# Patient Record
Sex: Female | Born: 1945 | Race: Black or African American | Hispanic: No | Smoking: Never smoker
Health system: Southern US, Community
[De-identification: ages and names within clinical notes are randomized; demographics above are authoritative.]

## PROBLEM LIST (undated history)

## (undated) DIAGNOSIS — F209 Schizophrenia, unspecified: Secondary | ICD-10-CM

## (undated) DIAGNOSIS — E119 Type 2 diabetes mellitus without complications: Secondary | ICD-10-CM

## (undated) DIAGNOSIS — I219 Acute myocardial infarction, unspecified: Secondary | ICD-10-CM

## (undated) DIAGNOSIS — I2510 Atherosclerotic heart disease of native coronary artery without angina pectoris: Secondary | ICD-10-CM

## (undated) DIAGNOSIS — M1990 Unspecified osteoarthritis, unspecified site: Secondary | ICD-10-CM

## (undated) DIAGNOSIS — I639 Cerebral infarction, unspecified: Secondary | ICD-10-CM

## (undated) DIAGNOSIS — K8020 Calculus of gallbladder without cholecystitis without obstruction: Secondary | ICD-10-CM

## (undated) DIAGNOSIS — I10 Essential (primary) hypertension: Secondary | ICD-10-CM

## (undated) DIAGNOSIS — K219 Gastro-esophageal reflux disease without esophagitis: Secondary | ICD-10-CM

## (undated) HISTORY — PX: OTHER SURGICAL HISTORY: SHX169

## (undated) HISTORY — DX: Gastro-esophageal reflux disease without esophagitis: K21.9

## (undated) HISTORY — DX: Calculus of gallbladder without cholecystitis without obstruction: K80.20

## (undated) HISTORY — DX: Cerebral infarction, unspecified: I63.9

## (undated) HISTORY — PX: CORONARY ARTERY BYPASS GRAFT: SHX141

## (undated) HISTORY — PX: VENTRAL HERNIA REPAIR: SHX424

## (undated) HISTORY — PX: TOTAL ABDOMINAL HYSTERECTOMY: SHX209

## (undated) HISTORY — DX: Acute myocardial infarction, unspecified: I21.9

---

## 2003-10-19 ENCOUNTER — Emergency Department (HOSPITAL_COMMUNITY): Admission: EM | Admit: 2003-10-19 | Discharge: 2003-10-19 | Payer: Self-pay | Admitting: Emergency Medicine

## 2004-02-25 ENCOUNTER — Encounter (INDEPENDENT_AMBULATORY_CARE_PROVIDER_SITE_OTHER): Payer: Self-pay | Admitting: *Deleted

## 2004-02-25 ENCOUNTER — Ambulatory Visit (HOSPITAL_COMMUNITY): Admission: RE | Admit: 2004-02-25 | Discharge: 2004-02-25 | Payer: Self-pay | Admitting: Gastroenterology

## 2004-03-14 ENCOUNTER — Inpatient Hospital Stay (HOSPITAL_COMMUNITY): Admission: EM | Admit: 2004-03-14 | Discharge: 2004-03-17 | Payer: Self-pay | Admitting: Emergency Medicine

## 2004-10-15 ENCOUNTER — Encounter: Admission: RE | Admit: 2004-10-15 | Discharge: 2004-10-15 | Payer: Self-pay | Admitting: Internal Medicine

## 2004-10-30 ENCOUNTER — Emergency Department (HOSPITAL_COMMUNITY): Admission: EM | Admit: 2004-10-30 | Discharge: 2004-10-30 | Payer: Self-pay | Admitting: Family Medicine

## 2004-11-04 ENCOUNTER — Inpatient Hospital Stay (HOSPITAL_COMMUNITY): Admission: EM | Admit: 2004-11-04 | Discharge: 2004-11-09 | Payer: Self-pay | Admitting: Emergency Medicine

## 2004-11-05 ENCOUNTER — Encounter (INDEPENDENT_AMBULATORY_CARE_PROVIDER_SITE_OTHER): Payer: Self-pay | Admitting: *Deleted

## 2004-11-27 ENCOUNTER — Ambulatory Visit (HOSPITAL_COMMUNITY): Admission: RE | Admit: 2004-11-27 | Discharge: 2004-11-27 | Payer: Self-pay | Admitting: Cardiovascular Disease

## 2005-03-06 ENCOUNTER — Emergency Department (HOSPITAL_COMMUNITY): Admission: EM | Admit: 2005-03-06 | Discharge: 2005-03-06 | Payer: Self-pay | Admitting: Family Medicine

## 2005-05-14 ENCOUNTER — Emergency Department (HOSPITAL_COMMUNITY): Admission: EM | Admit: 2005-05-14 | Discharge: 2005-05-14 | Payer: Self-pay | Admitting: Family Medicine

## 2005-08-09 ENCOUNTER — Emergency Department (HOSPITAL_COMMUNITY): Admission: EM | Admit: 2005-08-09 | Discharge: 2005-08-09 | Payer: Self-pay | Admitting: Family Medicine

## 2005-11-21 ENCOUNTER — Emergency Department (HOSPITAL_COMMUNITY): Admission: EM | Admit: 2005-11-21 | Discharge: 2005-11-21 | Payer: Self-pay | Admitting: Family Medicine

## 2006-02-19 ENCOUNTER — Emergency Department (HOSPITAL_COMMUNITY): Admission: EM | Admit: 2006-02-19 | Discharge: 2006-02-19 | Payer: Self-pay | Admitting: Family Medicine

## 2006-05-26 ENCOUNTER — Emergency Department (HOSPITAL_COMMUNITY): Admission: EM | Admit: 2006-05-26 | Discharge: 2006-05-26 | Payer: Self-pay | Admitting: Family Medicine

## 2006-10-25 ENCOUNTER — Encounter: Admission: RE | Admit: 2006-10-25 | Discharge: 2006-10-25 | Payer: Self-pay | Admitting: Internal Medicine

## 2006-11-19 ENCOUNTER — Emergency Department (HOSPITAL_COMMUNITY): Admission: EM | Admit: 2006-11-19 | Discharge: 2006-11-19 | Payer: Self-pay | Admitting: Family Medicine

## 2007-02-04 ENCOUNTER — Emergency Department (HOSPITAL_COMMUNITY): Admission: EM | Admit: 2007-02-04 | Discharge: 2007-02-04 | Payer: Self-pay | Admitting: Family Medicine

## 2007-10-29 ENCOUNTER — Encounter: Admission: RE | Admit: 2007-10-29 | Discharge: 2007-10-29 | Payer: Self-pay | Admitting: Obstetrics and Gynecology

## 2008-03-11 ENCOUNTER — Inpatient Hospital Stay (HOSPITAL_COMMUNITY): Admission: EM | Admit: 2008-03-11 | Discharge: 2008-03-14 | Payer: Self-pay | Admitting: Emergency Medicine

## 2008-04-14 ENCOUNTER — Emergency Department (HOSPITAL_COMMUNITY): Admission: EM | Admit: 2008-04-14 | Discharge: 2008-04-14 | Payer: Self-pay | Admitting: Emergency Medicine

## 2008-10-29 ENCOUNTER — Encounter: Admission: RE | Admit: 2008-10-29 | Discharge: 2008-10-29 | Payer: Self-pay | Admitting: Internal Medicine

## 2009-01-28 ENCOUNTER — Ambulatory Visit: Payer: Self-pay | Admitting: Vascular Surgery

## 2009-02-04 ENCOUNTER — Ambulatory Visit: Payer: Self-pay | Admitting: Vascular Surgery

## 2009-05-01 ENCOUNTER — Ambulatory Visit: Payer: Self-pay | Admitting: Vascular Surgery

## 2009-05-27 ENCOUNTER — Ambulatory Visit: Payer: Self-pay | Admitting: Vascular Surgery

## 2009-06-03 ENCOUNTER — Ambulatory Visit: Payer: Self-pay | Admitting: Vascular Surgery

## 2010-09-13 ENCOUNTER — Emergency Department (HOSPITAL_COMMUNITY)
Admission: EM | Admit: 2010-09-13 | Discharge: 2010-09-13 | Payer: Self-pay | Source: Home / Self Care | Admitting: Family Medicine

## 2010-11-01 ENCOUNTER — Inpatient Hospital Stay (HOSPITAL_COMMUNITY)
Admission: EM | Admit: 2010-11-01 | Discharge: 2010-11-03 | Payer: Self-pay | Source: Home / Self Care | Attending: Cardiovascular Disease | Admitting: Cardiovascular Disease

## 2010-11-01 ENCOUNTER — Emergency Department (HOSPITAL_COMMUNITY)
Admission: EM | Admit: 2010-11-01 | Discharge: 2010-11-01 | Disposition: A | Discharge status: Other Acute Inpatient Hospital | Payer: Self-pay | Source: Home / Self Care | Admitting: Emergency Medicine

## 2010-12-11 ENCOUNTER — Encounter: Payer: Self-pay | Admitting: Cardiovascular Disease

## 2010-12-27 ENCOUNTER — Emergency Department (HOSPITAL_COMMUNITY)
Admission: EM | Admit: 2010-12-27 | Discharge: 2010-12-27 | Disposition: A | Discharge status: Home / Self Care | Payer: PRIVATE HEALTH INSURANCE | Attending: Emergency Medicine | Admitting: Emergency Medicine

## 2010-12-27 ENCOUNTER — Emergency Department (HOSPITAL_COMMUNITY): Payer: PRIVATE HEALTH INSURANCE

## 2010-12-27 DIAGNOSIS — I10 Essential (primary) hypertension: Secondary | ICD-10-CM | POA: Insufficient documentation

## 2010-12-27 DIAGNOSIS — R1310 Dysphagia, unspecified: Secondary | ICD-10-CM | POA: Insufficient documentation

## 2010-12-27 DIAGNOSIS — E7800 Pure hypercholesterolemia, unspecified: Secondary | ICD-10-CM | POA: Insufficient documentation

## 2010-12-27 DIAGNOSIS — R112 Nausea with vomiting, unspecified: Secondary | ICD-10-CM | POA: Insufficient documentation

## 2010-12-27 DIAGNOSIS — M549 Dorsalgia, unspecified: Secondary | ICD-10-CM | POA: Insufficient documentation

## 2010-12-27 DIAGNOSIS — Z951 Presence of aortocoronary bypass graft: Secondary | ICD-10-CM | POA: Insufficient documentation

## 2010-12-27 DIAGNOSIS — E669 Obesity, unspecified: Secondary | ICD-10-CM | POA: Insufficient documentation

## 2010-12-27 DIAGNOSIS — R072 Precordial pain: Secondary | ICD-10-CM | POA: Insufficient documentation

## 2010-12-27 DIAGNOSIS — I252 Old myocardial infarction: Secondary | ICD-10-CM | POA: Insufficient documentation

## 2010-12-27 DIAGNOSIS — E119 Type 2 diabetes mellitus without complications: Secondary | ICD-10-CM | POA: Insufficient documentation

## 2010-12-27 DIAGNOSIS — R61 Generalized hyperhidrosis: Secondary | ICD-10-CM | POA: Insufficient documentation

## 2010-12-27 DIAGNOSIS — K219 Gastro-esophageal reflux disease without esophagitis: Secondary | ICD-10-CM | POA: Insufficient documentation

## 2010-12-27 DIAGNOSIS — I2510 Atherosclerotic heart disease of native coronary artery without angina pectoris: Secondary | ICD-10-CM | POA: Insufficient documentation

## 2010-12-27 LAB — POCT CARDIAC MARKERS
CKMB, poc: <1 ng/mL — ABNORMAL LOW (ref 1.0–8.0)
Myoglobin, poc: 32.7 ng/mL (ref 12–200)
Troponin i, poc: <0.05 ng/mL (ref 0.00–0.09)

## 2010-12-27 LAB — CBC
HCT: 34.7 % — ABNORMAL LOW (ref 36.0–46.0)
Hemoglobin: 11.2 g/dL — ABNORMAL LOW (ref 12.0–15.0)
MCH: 28.1 pg (ref 26.0–34.0)
MCHC: 32.3 g/dL (ref 30.0–36.0)
MCV: 87.2 fL (ref 78.0–100.0)
Platelets: 222 10*3/uL (ref 150–400)
RBC: 3.98 MIL/uL (ref 3.87–5.11)
RDW: 13.4 % (ref 11.5–15.5)
WBC: 8 10*3/uL (ref 4.0–10.5)

## 2010-12-27 LAB — COMPREHENSIVE METABOLIC PANEL
ALT: 13 U/L (ref 0–35)
AST: 23 U/L (ref 0–37)
Albumin: 3.6 g/dL (ref 3.5–5.2)
Alkaline Phosphatase: 47 U/L (ref 39–117)
BUN: 6 mg/dL (ref 6–23)
CO2: 26 mEq/L (ref 19–32)
Calcium: 8.8 mg/dL (ref 8.4–10.5)
Chloride: 106 mEq/L (ref 96–112)
Creatinine, Ser: 0.81 mg/dL (ref 0.4–1.2)
GFR calc Af Amer: >60 mL/min (ref >60)
GFR calc non Af Amer: >60 mL/min (ref >60)
Glucose, Bld: 139 mg/dL — ABNORMAL HIGH (ref 70–99)
Potassium: 4.4 mEq/L (ref 3.5–5.1)
Sodium: 141 mEq/L (ref 135–145)
Total Bilirubin: 0.7 mg/dL (ref 0.3–1.2)
Total Protein: 6.6 g/dL (ref 6.0–8.3)

## 2010-12-27 LAB — DIFFERENTIAL
Basophils Absolute: 0 10*3/uL (ref 0.0–0.1)
Basophils Relative: 0 % (ref 0–1)
Eosinophils Absolute: 0.1 10*3/uL (ref 0.0–0.7)
Eosinophils Relative: 1 % (ref 0–5)
Lymphocytes Relative: 33 % (ref 12–46)
Lymphs Abs: 2.7 10*3/uL (ref 0.7–4.0)
Monocytes Absolute: 0.7 10*3/uL (ref 0.1–1.0)
Monocytes Relative: 8 % (ref 3–12)
Neutro Abs: 4.6 10*3/uL (ref 1.7–7.7)
Neutrophils Relative %: 57 % (ref 43–77)

## 2011-01-01 ENCOUNTER — Inpatient Hospital Stay (INDEPENDENT_AMBULATORY_CARE_PROVIDER_SITE_OTHER)
Admission: RE | Admit: 2011-01-01 | Discharge: 2011-01-01 | Disposition: A | Discharge status: Home / Self Care | Payer: PRIVATE HEALTH INSURANCE | Source: Ambulatory Visit | Attending: Emergency Medicine | Admitting: Emergency Medicine

## 2011-01-01 DIAGNOSIS — J45909 Unspecified asthma, uncomplicated: Secondary | ICD-10-CM

## 2011-01-01 DIAGNOSIS — R05 Cough: Secondary | ICD-10-CM

## 2011-01-01 DIAGNOSIS — R059 Cough, unspecified: Secondary | ICD-10-CM

## 2011-01-26 ENCOUNTER — Other Ambulatory Visit (HOSPITAL_COMMUNITY): Payer: Self-pay | Admitting: Internal Medicine

## 2011-01-26 DIAGNOSIS — Z1231 Encounter for screening mammogram for malignant neoplasm of breast: Secondary | ICD-10-CM

## 2011-01-31 LAB — CBC
HCT: 33.6 % — ABNORMAL LOW (ref 36.0–46.0)
HCT: 34.8 % — ABNORMAL LOW (ref 36.0–46.0)
Hemoglobin: 10.8 g/dL — ABNORMAL LOW (ref 12.0–15.0)
MCH: 28.3 pg (ref 26.0–34.0)
MCH: 28.3 pg (ref 26.0–34.0)
MCH: 28.4 pg (ref 26.0–34.0)
MCHC: 31.9 g/dL (ref 30.0–36.0)
MCHC: 32.1 g/dL (ref 30.0–36.0)
MCV: 88.2 fL (ref 78.0–100.0)
MCV: 88.8 fL (ref 78.0–100.0)
MCV: 89 fL (ref 78.0–100.0)
Platelets: 208 10*3/uL (ref 150–400)
Platelets: 208 10*3/uL (ref 150–400)
Platelets: 210 10*3/uL (ref 150–400)
RBC: 3.81 MIL/uL — ABNORMAL LOW (ref 3.87–5.11)
RDW: 13.2 % (ref 11.5–15.5)
RDW: 13.3 % (ref 11.5–15.5)
RDW: 13.3 % (ref 11.5–15.5)
WBC: 8.2 10*3/uL (ref 4.0–10.5)
WBC: 8.4 10*3/uL (ref 4.0–10.5)

## 2011-01-31 LAB — BASIC METABOLIC PANEL
BUN: 7 mg/dL (ref 6–23)
CO2: 27 mEq/L (ref 19–32)
Chloride: 110 mEq/L (ref 96–112)
Chloride: 110 mEq/L (ref 96–112)
Creatinine, Ser: 0.68 mg/dL (ref 0.4–1.2)
Creatinine, Ser: 0.7 mg/dL (ref 0.4–1.2)
GFR calc Af Amer: >60 mL/min (ref >60)
Glucose, Bld: 105 mg/dL — ABNORMAL HIGH (ref 70–99)
Potassium: 3.7 mEq/L (ref 3.5–5.1)

## 2011-01-31 LAB — GLUCOSE, CAPILLARY: Glucose-Capillary: 106 mg/dL — ABNORMAL HIGH (ref 70–99)

## 2011-01-31 LAB — MAGNESIUM: Magnesium: 1.2 mg/dL — ABNORMAL LOW (ref 1.5–2.5)

## 2011-01-31 LAB — COMPREHENSIVE METABOLIC PANEL
ALT: 12 U/L (ref 0–35)
AST: 21 U/L (ref 0–37)
Albumin: 3.4 g/dL — ABNORMAL LOW (ref 3.5–5.2)
Alkaline Phosphatase: 46 U/L (ref 39–117)
BUN: 9 mg/dL (ref 6–23)
CO2: 25 mEq/L (ref 19–32)
Calcium: 8.8 mg/dL (ref 8.4–10.5)
Chloride: 109 mEq/L (ref 96–112)
Creatinine, Ser: 0.68 mg/dL (ref 0.4–1.2)
GFR calc Af Amer: >60 mL/min (ref >60)
GFR calc non Af Amer: >60 mL/min (ref >60)
Glucose, Bld: 89 mg/dL (ref 70–99)
Potassium: 4.3 mEq/L (ref 3.5–5.1)
Sodium: 141 mEq/L (ref 135–145)
Total Bilirubin: 0.4 mg/dL (ref 0.3–1.2)
Total Protein: 6.2 g/dL (ref 6.0–8.3)

## 2011-01-31 LAB — BRAIN NATRIURETIC PEPTIDE
Pro B Natriuretic peptide (BNP): 59 pg/mL (ref 0.0–100.0)
Pro B Natriuretic peptide (BNP): 71 pg/mL (ref 0.0–100.0)

## 2011-01-31 LAB — CARDIAC PANEL(CRET KIN+CKTOT+MB+TROPI)
CK, MB: 1 ng/mL (ref 0.3–4.0)
Relative Index: INVALID (ref 0.0–2.5)
Total CK: 81 U/L (ref 7–177)
Troponin I: 0.01 ng/mL (ref 0.00–0.06)

## 2011-01-31 LAB — CK TOTAL AND CKMB (NOT AT ARMC): CK, MB: 1 ng/mL (ref 0.3–4.0)

## 2011-01-31 LAB — POCT CARDIAC MARKERS: CKMB, poc: <1 ng/mL — ABNORMAL LOW (ref 1.0–8.0)

## 2011-01-31 LAB — PROTIME-INR: INR: 0.97 (ref 0.00–1.49)

## 2011-01-31 LAB — TROPONIN I: Troponin I: 0.02 ng/mL (ref 0.00–0.06)

## 2011-01-31 LAB — HEMOGLOBIN A1C: Hgb A1c MFr Bld: 6.2 % — ABNORMAL HIGH (ref <5.7)

## 2011-02-02 ENCOUNTER — Ambulatory Visit (HOSPITAL_COMMUNITY)
Admission: RE | Admit: 2011-02-02 | Discharge: 2011-02-02 | Disposition: A | Discharge status: Home / Self Care | Payer: PRIVATE HEALTH INSURANCE | Source: Ambulatory Visit | Attending: Internal Medicine | Admitting: Internal Medicine

## 2011-02-02 DIAGNOSIS — Z1231 Encounter for screening mammogram for malignant neoplasm of breast: Secondary | ICD-10-CM | POA: Insufficient documentation

## 2011-04-05 NOTE — Assessment & Plan Note (Signed)
OFFICE VISIT   Nicole Lloyd, Nicole Lloyd  DOB:  05/12/1946                                       06/03/2009  CHART#:[id number]   The patient presents today in 1-week followup of laser ablation of her  left small saphenous vein and stab phlebectomy of multiple tributaries.  She has done quite well since the procedure.  Reports minimal  discomfort.  She has very mild bruising in her calf and no discomfort.  She underwent repeat duplex today in our office, and this reveals  closure of her small saphenous vein with no evidence of injury to her  deep system.  I am quite pleased with her initial evaluation and  progress, as is the patient.  We will see her again in 6 weeks for final  followup.   Larina Earthly, M.D.  Electronically Signed   TFE/MEDQ  D:  06/03/2009  T:  06/04/2009  Job:  [id number]   cc:   Merlene Laughter. Renae Gloss, M.D.

## 2011-04-05 NOTE — Assessment & Plan Note (Signed)
OFFICE VISIT   MIKITA, LESMEISTER  DOB:  28-Dec-1945                                       05/27/2009  OACZY#:[id number]   The patient presents today for treatment of her left leg venous  hypertension.  She underwent laser ablation of her left small saphenous  vein and stab phlebectomy multiple tributary varicosities in her  posterior calf.  She did quite well with the procedure without immediate  complication and will see me in 1 week for a duplex follow-up.   Larina Earthly, M.D.  Electronically Signed   TFE/MEDQ  D:  05/27/2009  T:  05/28/2009  Job:  [id number]

## 2011-04-05 NOTE — Procedures (Signed)
LOWER EXTREMITY VENOUS REFLUX EXAM   INDICATION:  Bilateral varicose veins.   EXAM:  Using color-flow imaging and pulse Doppler spectral analysis, the  bilateral common femoral, superficial femoral, popliteal, posterior  tibial, greater and lesser saphenous veins are evaluated.  There is  evidence suggesting deep venous insufficiency in the bilateral lower  extremities at the popliteal level in the right lower extremity and at  the common femoral level in the left lower extremity.   The right saphenofemoral junction is competent.  The left GSV is not  competent with the caliber as described below.  The bilateral greater  saphenous veins are competent.   The right proximal short saphenous vein demonstrates competency.  The  left proximal short saphenous vein demonstrates incompetency with  diameter measurements ranging from 0.37 to 0.87 cm.   GSV Diameter (used if found to be incompetent only)                                            Right    Left  Proximal Greater Saphenous Vein           cm       cm  Proximal-to-mid-thigh                     cm       cm  Mid thigh                                 cm       cm  Mid-distal thigh                          cm       cm  Distal thigh                              cm       cm  Knee                                      cm       cm   IMPRESSION:  1. Bilateral greater saphenous vein reflux is not identified.  The      bilateral greater saphenous veins are not tortuous.  2. The deep venous system is not competent, as described above.  3. The left lesser saphenous vein is not competent.   ___________________________________________  Larina Earthly, M.D.   CH/MEDQ  D:  01/28/2009  T:  01/28/2009  Job:  [id number]

## 2011-04-05 NOTE — Procedures (Signed)
DUPLEX DEEP VENOUS EXAM - LOWER EXTREMITY   INDICATION:  Postop left lower extremity venous ablation.   HISTORY:  Edema:  No  Trauma/Surgery:  Left lesser saphenous vein ablation on 05/27/2009 by  Dr. Arbie Cookey.  Pain:  No  PE:  No  Previous DVT:  No  Anticoagulants:  No  Other:   DUPLEX EXAM:                CFV   SFV   PopV  PTV    GSV                R  L  R  L  R  L  R   L  R  L  Thrombosis    o  o     o     o      o     o  Spontaneous   +  +     +     +      +     +  Phasic        +  +     +     +      +     +  Augmentation  +  +     +     +      +     +  Compressible  +  +     +     +      +     +  Competent     +  +     +     +      +     +   Legend:  + - yes  o - no  p - partial  D - decreased   IMPRESSION:  No evidence of deep vein thrombosis in the left lower  extremity.  The left lesser saphenous vein appears ablated from proximal to distal.    _____________________________  Larina Earthly, M.D.   AC/MEDQ  D:  06/03/2009  T:  06/04/2009  Job:  [id number]

## 2011-04-05 NOTE — Consult Note (Signed)
NAMEDONELDA, MAILHOT NO.:  [id number]   MEDICAL RECORD NO.:  [id number]          PATIENT TYPE:  INP   LOCATION:  2923                         FACILITY:  [facility]   PHYSICIAN:  Jordan Hawks. Elnoria Howard, MD    DATE OF BIRTH:  1946/05/13   DATE OF CONSULTATION:  DATE OF DISCHARGE:                                 CONSULTATION   PRIMARY CARE PHYSICIAN:  Dr. Andi Devon.   CARDIOLOGIST:  Dr. Nanetta Batty.   HISTORY OF PRESENT ILLNESS:  This is a 65 year old female with a past  medical history of coronary artery bypass graft in 2005, hyperlipidemia,  diabetes, obesity, depression, and PSVT status post ablation 2005, who  was admitted to the hospital with complaints of chest pain.  The patient  states that her chest pain started on Sunday around the time that she  was going to church, and had her pain progressively worsened and she did  see Dr. Allyson Sabal for her routine checkup on the following day.  No acute  findings were noted at that time and when she went home, she noticed  that her chest pain markedly worsened despite the use of her meds.  She  felt that her chest pain did worse after she ate bacon and eggs.  No  prior history of any gastroesophageal reflux disease, and at this time,  she does report of having some mild symptoms of dysphagia.  With the use  of nitroglycerin, her pain did improve.  She had some mild shortness of  breath that was associated with her chest pain as well as diaphoresis.  The patient states that her symptoms are similar to her prior history of  cardiac symptoms.  Subsequently, the patient presented to the hospital  for further evaluation and treatment.  Her last day of catheterization  was in 2005 with notation of diffuse severe disease in her RCA, but  there was a patent radial graft and patent LMA to the SVG.  Additionally, the patient denied having any on nausea or vomiting.   PAST MEDICAL AND SURGICAL HISTORY:  As stated above.   FAMILY  HISTORY:  Noncontributory for current symptoms.   REVIEW OF SYSTEMS:  As stated above in the history present illness;  otherwise, negative for the 10-point review of systems.   ALLERGIES:  No known drug allergies.   MEDICATIONS:  1. Aspirin.  2. Plavix.  3. Lexapro.  4. Sliding-scale insulin.  5. Magnesium oxide.  6. Metoprolol.  7. Protonix.  8. Actos.  9. Altace.  10.Crestor.  11.Ambien.  12.Zofran.   PHYSICAL EXAMINATION:  VITAL SIGNS:  Blood pressure is 140/54, heart  rate is 57, respirations 20, afebrile.  GENERAL:  The patient is in no acute distress, alert and oriented.  HEENT:  Normocephalic, atraumatic.  Extraocular muscles intact.  NECK:  Supple.  No lymphadenopathy.  LUNGS:  Clear to auscultation bilaterally.  CARDIOVASCULAR:  Regular rate and rhythm.  ABDOMEN:  Obese, soft, tender in the epigastric region.  No rebound or  rigidity.  Positive bowel sounds.  EXTREMITIES:  No clubbing, cyanosis, or edema.   LABORATORY DATA:  White blood cell count is 99.0, hemoglobin 9.6, MCV is  88.2.  Sodium 141, potassium 4.0, chloride 110, CO2 25, glucose 104, BUN  7, creatinine 0.7, total bilirubin 0.2 alkaline phosphate 56, AST is 15,  ALT is 10, and albumin is 3.0.  Heme positive as per the chart.   IMPRESSION:  1. Chest pain.  2. Heme-positive stool.  3. History of coronary artery disease.  After evaluation of the patient. it does appear that she has  gastroesophageal reflux symptoms, the symptoms such as these can respond  to nitroglycerin, in regards to esophageal spasm that can result from  acid irritation, but the finding of the heme-positive stool, further  evaluation with the EGD is required.  She had a colonoscopy by Dr. Loreta Ave  in 2005, for a prior history of polyps and at that time she has also  complain of hematochezia.  She was noted to have hemorrhoids, but the  colonoscopy was suboptimal in regards to the prep; otherwise, no other  obvious findings were  noted during that examination.   PLAN:  At this time is to perform an EGD and further recommendations  will be made pending the findings.  She is to continue on Protonix and  sucralfate will be added on for her current persistent symptoms of  epigastric pain.      Jordan Hawks Elnoria Howard, MD  Electronically Signed     PDH/MEDQ  D:  03/12/2008  T:  03/13/2008  Job:  [id number]   cc:   Merlene Laughter. Renae Gloss, M.D.  Nanetta Batty, M.D.

## 2011-04-05 NOTE — Consult Note (Signed)
NEW PATIENT CONSULTATION   Nicole Lloyd, Nicole Lloyd  DOB:  21-Oct-1946                                       01/28/2009  CHART#:[id number]   The patient presents today for evaluation of left leg venous pathology.  She is a very pleasant 65 year old black female with concern regarding  pain, swelling and aching sensation in her left calf.  She reports this  has been present for many years and has been progressive.  She does not  have any history of deep venous thrombosis or bleeding.   MEDICAL HISTORY:  Is significant for non-insulin-dependent diabetes  since age 34.  She did have a myocardial infarction and is status post  coronary artery bypass grafting in 2000.  She did have harvest of her  left great saphenous vein for bypass.  She also had left radial artery  harvest.  She has been stable since that time.   SOCIAL HISTORY:  She is widowed with four children.  She is retired.  She does not smoke and has an occasional glass of wine.   Her medications are attached to her chart.   On physical examination a well-developed, well-nourished black female  appearing stated age of 65.  Blood pressure is 125/72, pulse 64,  respirations 18.  Her dorsalis pedis pulses are 2+ bilaterally.  She  does not have any evidence of venous pathology in the right leg.  Her  left leg is noted for scars from open vein harvest in her left great  saphenous vein, at her ankle, knee and thigh.  She does have marked  tributary varicosities in her posterior calf.  She does have some  hemosiderin deposit around these areas as well.   She underwent noninvasive vascular laboratory studies in our office and  this revealed no evidence of reflux in her right saphenous system.  Her  left great saphenous vein was surgically absent.  She did have marked  reflux in a dilated left small saphenous vein.  These did feed the large  tributary varicosities in her posterior calf.   I discussed this at length  with the patient.  I explained that this did  not cause any life threatening problem and did not significantly  increase her risk for other major venous problems.  I did explain  indication would be if she were having significant pain associated with  this.  The patient reports that these are quite uncomfortable to her and  have been progressive.  She elevates her legs as much as possible and  does take ibuprofen for discomfort.  She has not been fitted with  graduated compression stockings.  She was fitted for knee high 20-30  mmHg graduated compression stockings today and I instructed her on the  use of these on her left leg.  She will continue this treatment and we  will see her again for further discussion and followup in 3 months.   Larina Earthly, M.D.  Electronically Signed   TFE/MEDQ  D:  01/28/2009  T:  01/29/2009  Job:  [id number]   cc:   Merlene Laughter. Renae Gloss, M.D.

## 2011-04-05 NOTE — Assessment & Plan Note (Signed)
OFFICE VISIT   ZUMA, HUST  DOB:  16-Jul-1946                                       05/01/2009  CHART#:[id number]   The patient presents today for continued followup of her left leg venous  hypertension.  I had seen her initially in consultation on 01/28/2009.  She had pain, swelling and aching sensation in her left calf.  At that  time she underwent noninvasive vascular laboratory studies revealing  gross reflux in her small saphenous vein feeding into these tributaries.  She was fitted with graduated compression stockings and we are seeing  her for followup.  She reports that she has continued to have severe  discomfort over these areas.  She has not been able to tolerate the  compression garments due to increased pain and severe itching under the  compression.  She reports that when she is standing on her feet and  working at home the pain is much worse and needs to prop her feet up and  also working outside with gardening has to stop and elevate her feet.   I discussed the options with the patient.  I have recommended that we  proceed with laser ablation of her small saphenous vein on the left and  stab phlebectomy of her multiple tributary varicosities.  I explained  this is an outpatient procedure taking approximately 1-1/2 hours in our  office under local anesthesia.  She understands the procedure and  potential risks and we will schedule her at her convenience.   Larina Earthly, M.D.  Electronically Signed   TFE/MEDQ  D:  05/01/2009  T:  05/01/2009  Job:  [id number]   cc:   Merlene Laughter. Renae Gloss, M.D.

## 2011-04-08 NOTE — Op Note (Signed)
Nicole Lloyd, Nicole Lloyd                          ACCOUNT NO.:  [id number]   MEDICAL RECORD NO.:  [id number]                   PATIENT TYPE:  AMB   LOCATION:  ENDO                                 FACILITY:  [facility]   PHYSICIAN:  Anselmo Rod, M.D.               DATE OF BIRTH:  [birth year]   DATE OF PROCEDURE:  02/25/2004  DATE OF DISCHARGE:  02/25/2004                                 OPERATIVE REPORT   PROCEDURE:  Colonoscopy with cold biopsy x 1.   ENDOSCOPIST:  Charna Elizabeth, M.D.   INSTRUMENT USED:  Olympus video colonoscope.   INDICATIONS FOR PROCEDURE:  65 year old diabetic female with guaiac positive  stools.  Rule out colonic polyps, masses, etc.   PREPROCEDURE PREPARATION:  Informed consent was procured from the patient.  The patient was fasted for eight hours prior to the procedure and prepped  with a bottle of magnesium citrate and a gallon of GoLYTELY the night prior  to the procedure.   PREPROCEDURE PHYSICAL:  Patient with stable vital signs.  Neck supple.  Chest clear to auscultation.  S1 and S2 regular.  Abdomen soft with normal  bowel sounds.   DESCRIPTION OF PROCEDURE:  The patient was placed in the left lateral  decubitus position, sedated with 80 mg of Demerol and 8 mg Versed in slow  incremental doses.  Once the patient was adequately sedated, maintained on  low flow oxygen and continuous cardiac monitoring, the Olympus video  colonoscope was advanced into the rectum to the cecum.  There was a large  amount of residual stool in the colon, multiple washes were done.  A small  polyp was biopsied from the rectum.  Small internal hemorrhoids were  appreciated on retroflexion and anal inspection respectively.  Small lesions  could have been missed secondary to a relatively poor prep.  The patient  tolerated the procedure well without complications.   IMPRESSION:  1. Small internal and external hemorrhoids.  2. Small sessile polyp biopsied from the rectum.  3. Large  amount of residual stool in the colon, small lesions could have     been missed.   RECOMMENDATIONS:  1. Continue on high fiber diet.  2. Outpatient follow up in the next two weeks for further recommendations.                                               Anselmo Rod, M.D.    JNM/MEDQ  D:  02/27/2004  T:  02/27/2004  Job:  [id number]   cc:   Olene Craven, M.D.  [address]  [address]  [place]  [place] [address]  Fax: [phone]

## 2011-04-08 NOTE — Consult Note (Signed)
Nicole Lloyd, Nicole Lloyd NO.:  [id number]   MEDICAL RECORD NO.:  [id number]          PATIENT TYPE:  INP   LOCATION:                               FACILITY:  [facility]   PHYSICIAN:  Mark E. Severiano Gilbert, M.D.    DATE OF BIRTH:  Jul 22, 1946   DATE OF CONSULTATION:  11/05/2004  DATE OF DISCHARGE:                                   CONSULTATION   NEW PATIENT CONSULTATION   SOURCE OF CONSULTATION:  Dr. Chanda Busing.   REASON FOR CONSULTATION:  Evaluation for palpitations and tachycardia.   HISTORY OF PRESENT ILLNESS:  This is a 65 year old African-American female  with a history of coronary artery disease who presents via the emergency  department after a prolonged episode of palpitations, heart racing, and  crushing substernal chest pain reminiscent of her angina pectoris and prior  myocardial infarction.  She presented to the [facility]  with chest pain and a heart rate of approximately 130 beats per minute with  a differential diagnosis of acute coronary syndrome, accelerated junctional  tachycardia, atrial flutter or fibrillation.  The patient was treated with  nitrates, Cardizem drip, and converted to normal sinus rhythm while in the  emergency department.  Of note, vagal maneuvers in the ED failed to convert  the patient to sinus rhythm.  With resolution of the tachycardia, there was  prompt resolution of the patient's substernal chest pain.  She was treated  with IV diltiazem, continuation of her outpatient Toprol, heparinization,  and a consideration for amiodarone therapy for atrial fibrillation.  She  ruled out for acute myocardial infarction by serial troponin and was  admitted to one of the telemetry floors for continued care.   PAST MEDICAL HISTORY:  1.  Atherosclerotic coronary artery disease status post bypass operation in      the year 2000.  2. History of acute myocardial infarction in the 1980s.      3. Non-insulin-dependent diabetes  mellitus.  4. Hyperlipidemia.  5.      Supraventricular tachycardia as described in the HPI.   ALLERGIES:  She is allergic to MORPHINE secondary to a histamine release  reaction of itching.   FAMILY HISTORY:  Positive for hypertension, atherosclerotic coronary artery  disease, heart failure, and stroke.   OUTPATIENT MEDICATIONS:  1.  Crestor 10 mg p.o. daily.  2.  Ambien 10 mg p.o. daily.  3.  Aspirin 81 mg p.o. daily.  4.  Zetia 10 mg p.o. daily.  5.  Actos 45 mg daily.  6.  Lanoxin 0.125 mg p.o. daily.  7.  Micronase 5 mg p.o. daily.  8.  Lexapro 10 mg p.o. daily.  9.  Toprol-XL 50 mg p.o. daily.  10. Protonix 1 per day.   SOCIAL HISTORY:  The patient lives alone.  She has family members in the  area.  She has a number of children.  She denies alcohol or tobacco abuse.  She is widowed.   REVIEW OF SYSTEMS:  GENERAL:  She has no change in her weight.  She has no  fever, sweats, or chills.  MUSCULOSKELETAL: She has minor arthritic  complaints.  GU:  She has no dysuria.  RESPIRATORY:  She has had shortness  of breath for the last several days during her tachycardia.  CARDIOVASCULAR:  She has chest pain as described in the HPI and palpitations, no syncope.  ENDOCRINE:  She has symptoms of hyperglycemia from time to time, no  hypoglycemic symptoms, no thyroid dysfunction symptoms.  NEUROLOGIC:  No TIA  or seizure symptoms, no syncope.  SKIN:  No lesions, dermatitis, or  ulceration.  HEENT:  Negative.  PSYCHIATRIC:  She is neither depressed nor  manic.   PHYSICAL EXAMINATION:  GENERAL:  She is an overweight, African-American  female in no acute distress seen at the bedside on the telemetry ward.  Blood pressure 112/63, pulse 55 and regular, saturation on room air 96%,  afebrile, respiratory rate 12-16.  HEENT:  Atraumatic, normocephalic, midline nares, moist mucosal membranes.  NECK:  Obese, 2+ and equal carotids without bruit, JVP 5-7, no thyromegaly,  no adenopathy.  CHEST:   Normal female.  BACK:  Negative CVAT.  RESPIRATORY:  Clear to auscultation and percussion.  CARDIOVASCULAR:  Regular rate and rhythm, positive S4, negative S3, no rubs,  murmurs, or thrills.  ABDOMEN:  Positive bowel sounds, soft, nontender, no hepatosplenomegaly, no  masses or bruits.  GU/GI:  Deferred.  EXTREMITIES:  2+ and equal pulses, no signs of clubbing, there was trace  edema.  SKIN:  No areas of ulceration, decubitus, or breakdown.  NEUROLOGIC:  Alert and oriented x4, very pleasant.  Motor and sensory exam  nonfocal.  Cranial nerves II-XII were intact.   LABORATORY DATA:  Troponin all less than 0.03 x3.  Potassium 4, creatinine  0.9, fasting blood sugar 124.  Hematocrit 32% which is baseline, his level  is less than 0.2.  Echocardiogram pending.  Presenting electrocardiogram  demonstrates PSVT most consistent with AVNRT, heart rate 127 beats per  minute.  Post spontaneous conversion, she has normal sinus rhythm, normal  EKG.  Thyroid function tests are pending.   ASSESSMENT:  1.  Paroxysmal supraventricular tachycardia.  The patient has a history of      underlying structural heart disease from coronary artery disease and I      think that her episodes of PSVT provoke a double product related anginal      response.  This is manifested by the prompt relief of her angina with      conversion of her heart rate to normal sinus rhythm.  As such, neither      the troponins or her physical presentation suggest underlying acute      coronary syndrome.  Review of electrocardiograms are clearly not      consistent with atrial fibrillation or atrial flutter and therefore she      has no long-term anticoagulation needs at the current time.  Aspirin      therapy would be appropriate given her diagnosis of coronary artery      disease.  Because of the significant symptoms that her PSVT causes and     the recurrent nature of it despite treatment with beta blockers, I would      offer her  an EP study with RF ablation as frontline therapy.  After      hearing the risks and benefits of this procedure, the patient elects to      consider this with her family members with a tentative plan date of      Monday for radiofrequency  ablation of presumptive AVNRT slow pathway.      2. Atherosclerotic coronary disease.  Continue to use anti-ischemic and      anti-dyslipidemic therapies.  3. She needs DVT prophylaxis given her      age, coexistent cardiopulmonary disease, and relative bedrest in the      hospital.   PLAN:  1.  Discontinue Coumadin therapy as no indication as well as IV heparin      therapy for full-dose anticoagulation.  2. Discontinue Cardizem drip as      it would be very nice to see her initiate her tachycardia in the      hospital while on telemetry.  3. Taper beta blockers in preparation for      EP study.  4. DVT prophylaxis.        ___________________________________________  Janeece Riggers Severiano Gilbert, M.D.    MEP/MEDQ  D:  11/05/2004  T:  11/07/2004  Job:  [id number]

## 2011-04-08 NOTE — Discharge Summary (Signed)
NAME:  URANIA, PEARLMAN                        ACCOUNT NO.:  [id number]   MEDICAL RECORD NO.:  [id number]                   PATIENT TYPE:  INP   LOCATION:  4737                                 FACILITY:  [facility]   PHYSICIAN:  Olene Craven, M.D.            DATE OF BIRTH:  August 08, 1946   DATE OF ADMISSION:  03/14/2004  DATE OF DISCHARGE:  03/17/2004                                 DISCHARGE SUMMARY   DISCHARGE DIAGNOSIS:  1. Chest pain/coronary artery disease.  2. Hypertension.  3. Hyperlipidemia.  4. Diabetes.  5. Obesity.   DISCHARGE MEDICATIONS:  1. Glucotrol XL 5 mg p.o. daily.  2. Glucophage 1000 mg p.o. b.i.d.  3. Actos 45 mg p.o. daily.  4. Digoxin 250 mcg p.o. daily.  5. Protonix 40 mg p.o. daily.  6. Aspirin 81 mg p.o. daily.  7. Toprol-XL 100 mg p.o. daily.  8. Zetia 10 mg p.o. nightly.  9. Arthrotec 75 mg 1 p.o. b.i.d. p.r.n.   FOLLOWUP:  The patient is to follow up with Dr. Olene Craven and with  Dr. Pearletha Furl. Alanda Amass as directed.   CONSULTS:  [facility], Dr. Pearletha Furl. Alanda Amass.   PROCEDURE:  The patient had cardiac catheterization which revealed patent  venous grafts.   HOSPITAL COURSE:  The patient was admitted on March 14, 2004 after  experiencing several episodes of substernal chest pain.  She was admitted to  rule out and for further evaluation.  In lieu of her past medical history of  diabetes, hypertension and coronary artery disease, it was felt that she  would benefit from recatheterization to assess the status of her  revascularization.  She ruled out enzymatically.  She was taken to cath and  found to have patent venous grafting from her previous CABG.  The patient's  chest pain did subside as her medications were adjusted.  She did have a  history of hyperlipidemia with intolerability to most statins; she was  started on Zetia 10 mg p.o. nightly and her diabetic medications were  resumed 48 hours post catheterization.   FOLLOWUP:  She is to follow up at [facility].   CONDITION ON DISCHARGE:  She was discharged in stable condition.                                                Olene Craven, M.D.    DEH/MEDQ  D:  04/21/2004  T:  04/21/2004  Job:  [id number]

## 2011-04-08 NOTE — Cardiovascular Report (Signed)
NAME:  Nicole Lloyd, Nicole Lloyd                        ACCOUNT NO.:  [id number]   MEDICAL RECORD NO.:  [id number]                   PATIENT TYPE:  INP   LOCATION:  4737                                 FACILITY:  [facility]   PHYSICIAN:  Richard A. Alanda Amass, M.D.          DATE OF BIRTH:  Jun 19, 1946   DATE OF PROCEDURE:  03/16/2004  DATE OF DISCHARGE:                              CARDIAC CATHETERIZATION   PROCEDURE:  Retrograde central aortic catheterization, selective coronary  angiography via Judkins technique, LV angiogram RAO, LAO projection,  saphenous vein graft angiography, LIMA angiography, right radial artery  angiography, abdominal angiogram midstream PA projection.   DESCRIPTION OF PROCEDURE:  The above procedure was done with the patient in  the postabsorptive state after 5 mg of Valium p.o. premedication with  heparin on hold.  She was given 2 mg of Versed in the lab for sedation.  She  was hydrated preoperatively.  Glucophage was on hold and Bextra was  discontinued.  The right groin was prepped, draped in the usual manner.  1%  Xylocaine was used for local anesthesia.  The CRFA was entered with single  anterior puncture using 18 thin-wall needle.  A 6 French short Daig side arm  sheath was inserted without difficulty.  Selective coronary angiography was  done with 6 French 4 cm taper preformed coronary and pigtail Cordis  catheters.  Right coronary catheter was used for saphenous vein graft  angiography and selective right radial angiography.  A 6 Jamaica IMA catheter  was used for selective LIMA injection.  6 French pigtail catheter was used  for LV angiogram in the RAO projection at 22 mL, 14 mL per second and hand  injection in the LAO projection to conserve dye.  Abdominal angiogram was  done in the midstream PA projection at 20 mL, 20 mL per second with  visualization of the proximal iliacs bilaterally.  Catheters removed. Side  arm sheath was flushed.  The patient was  hypertensive.  She was able to void  on the table.  She was given IC nitroglycerin 200 mcg during the procedure  and she was given 10 mg of labetalol for blood pressure control post  procedure.  Blood pressure came down from 200 to 150 mmHg.  She tolerated  the procedure well and transferred to the holding area for sheath removal  and pressure hemostasis with normal ACT measured in the lab.   PRESSURES:  1. LV:  170-200/0; LVEDP 18-24 mmHg.  2. CA:  170-200/80-90 mmHg.  3. There is no gradient across the aortic valve on catheter pullback.   Fluoroscopy showed 2+ left and right coronary calcification.  It was  difficult to visualize the right coronary proximal and mid stents from  remote procedure, but because of calcification in the native vessel.   LV angiogram in the RAO and LAO projection showed hypokinesis of the basal  inferior wall and hypo-akinesis of the small area in the  posterior apical  wall.  Estimated EF was greater than 55% with no mitral regurgitation.  There was angiographic LVH present.   Abdominal angiogram in the midstream PA projection showed 40% smooth left  renal artery stenosis with single renal artery. 30%or less right renal  artery stenosis which was smooth.  The celiac and SMA axis were intact.  The  IMA was intact.  There was no significant aortoiliac disease with normal  proximal iliacs bilaterally and no aneurysm.  The hypogastrics were intact  bilaterally and the external iliacs were normal bilaterally.   The main left coronary artery was long.  There was a 75% smooth narrowing at  the distal portion of the left main.   The circumflex was totally occluded beyond a very small marginal branch.  There was grade 3 collaterals from a left atrial branch that anastomosed  directly with a large trifurcating circumflex marginal branch.  This  apparently is an old finding.  The circumflex marginal trifurcated and had  no significant disease with excellent  filling via these grade 3 collaterals.  There was no recannulization of the totally occluded circumflex.   The left anterior descending artery had 85% stenosis before the first large  septal perforator and another segmental 85-90% stenosis at a bend after the  first septal perforator at the junction of the proximal mid third of the  LAD.  There was retrograde filling of the LIMA from its anastomosis in the  mid LAD and there was some LAD filling to the apex through the native  vessel, but this was competitive.  There was a small bifurcating diagonal  present before the septal perforator.   The native right coronary had diffuse disease with 95% proximal and mid  stenosis and another 90% segmental mid after the RV branch.  There was 95%  stenosis beyond the acute margin.  There was antegrade filling of the  bifurcating PLA and PDA which were underfilled from the native vessel.   The right radial graft was widely patent, smooth and beautifully anastomosed  to the PDA branch of the RCA and an excellent sequential branch to one of  the PLA branches with good flow and backfilling of the very distal vessel.  There were irregularities in the native vessel, but no high grade stenosis.   The LIMA was widely patent with an excellent anastomosis to the mid LAD and  good filling of the LAD down to the apex and retrograde filling of a  diagonal.  There was irregularity throughout the distal portion of the LAD,  but no high grade stenosis beyond the anastomotic site.   Saphenous vein graft to the diagonal was widely patent with an excellent  anastomosis to the diagonal.  There was large trifurcating diagonal beyond  this with good filling.   DISCUSSION:  This very pleasant widow x2, mother of four with 10  grandchildren is a nonsmoker and has diabetes and unclear lipid status.  She  has chronic history of coronary artery disease and underwent proximal and mid RCA stenting at [facility] October 01, 1996.  Attempts  at crossing the totally occluded circumflex were not successful and were  abandoned at that time.  These were multi-link stents.   She had progression of disease and ultimately underwent multivessel coronary  artery bypass graft February 01, 1999 at [facility] by Dr. Lahoma Rocker.  She has done well up until recently coming to [place] to live  near her daughters and  she has developed recurrent anginal symptoms along  with reflux symptoms that prompted her admission to the hospital on March 14, 2004.  Her lipid status is currently unknown.  She has exogenous  obesity, AODM, gastroesophageal reflux disease and arthritis.   She has widely patent LIMA to the LAD with no subclavian stenosis.  Widely  patent saphenous vein graft to a large trifurcating diagonal, widely patent  sequential left radial artery to the PDA, PLA.  She has excellent grade 3  collaterals from the circumflex arterial branch to large trifurcating  circumflex marginal.  This is somewhat jeopardized by distal left main  disease, but there is good flow and I believe this is chronic.  She has good  LV function despite small segmental wall motion abnormalities.  Systemic  hypertension with mild bilateral RAS.   I would recommend continued medical therapy in this setting, more vigorous  therapy of her hypertension and empiric GI therapy.  If she has recurrent  pain she may require further GI workup including endoscopy.  Continued  diabetes therapy and lipid-lowering for vigorous secondary prevention.   CATHETERIZATION DIAGNOSES:  1. Chest pain, etiology not determined.  2. Widely patent grafts as outlined above.  3. Well preserved left ventricular function with small wall motion     abnormality.  4. Exogenous obesity.  5. Adult onset diabetes mellitus.  6. Possible hyperlipidemia.  7. Systemic hypertension, mild bilateral renal artery stenosis.  8. Arthritis.  9.  Gastroesophageal reflux disease.                                               Richard A. Alanda Amass, M.D.    RAW/MEDQ  D:  03/16/2004  T:  03/16/2004  Job:  [id number]   cc:   Wilson Singer, M.D.  [address]  [place]  [place] [address]  Fax: [phone]   Cristy Hilts. Jacinto Halim, M.D.  [address]  [place]  [place] [address]  Fax: [phone]

## 2011-04-08 NOTE — Discharge Summary (Signed)
[name], [name] NO.:  [id number]   MEDICAL RECORD NO.:  [id number]          PATIENT TYPE:  INP   LOCATION:  3735                         FACILITY:  [facility]   PHYSICIAN:  Nicole Lloyd, M.D.   DATE OF BIRTH:  Feb 17, 1946   DATE OF ADMISSION:  11/04/2004  DATE OF DISCHARGE:  11/09/2004                                 DISCHARGE SUMMARY   DISCHARGE DIAGNOSES:  1.  Paroxysmal supraventricular tachycardia, status post radiofrequency      ablation this admission.  2.  Chest pain secondary to #1.  3.  Coronary disease with coronary artery bypass grafting in 2000, in      [place], [place].  Last catheterization in April 2005, with patent      grafts and negative Cardiolite study this admission.  4.  Non-insulin dependent diabetes mellitus.  5.  Obesity.  6.  Dyslipidemia.   HOSPITAL COURSE:  The patient is a 65 year old female with history of  coronary artery disease who had bypass surgery in 2000.  Last  catheterization was in April 2005.  She had patent grafts at that time.  She  presented on November 04, 2004, with chest pain.  She was admitted by Dr.  Elsie Lloyd.  She also noted tachycardia with her chest pain.  She was admitted  to telemetry and started on IV heparin.  CK-MB and troponins were obtained  and these were negative.  She was seen in consult by Dr. Severiano Lloyd.  She did  have a Cardiolite study that was negative for ischemia.  Echocardiogram was  also done this admission and was somewhat difficult to interpret, but LV  function was grossly normal.  EP study was done on November 08, 2004, and  she had successful ablation of SVT pathway.  We feel she can be discharged  on November 09, 2004.  Dr. Severiano Lloyd suggested she stop her Lanoxin.  We will  continue with beta-blocker.  She will see Dr. Allyson Lloyd in followup.   DISCHARGE MEDICATIONS:  1.  Crestor 10 mg a day.  2.  Coated aspirin once a day.  3.  Zetia 10 mg a day.  4.  Actos 45 mg a day.  5.  Micronase 5 mg  a day.  6.  Lexapro 10 mg a day.  7.  Toprol XL 50 mg a day.   DISCHARGE LABORATORY DATA AND X-RAY FINDINGS:  White count 8.0, hemoglobin  11.4, hematocrit 34.4, platelets 221.  Sodium 135, potassium 4.3, BUN 12,  creatinine 0.8.  Liver functions are normal.  CK-MB and troponins are  negative.  Digoxin level was less than 0.2.  TSH 1.93.  Lipid profile shows  a cholesterol of 226, HDL 46, LDL 155.  Urinalysis essentially unremarkable.  INR 0.8.   Chest x-ray on December 15, showed no active disease.  EKG showed sinus  rhythm and sinus bradycardia with a rate of 55.  She did have some atrial  flutter and SVT on December 18, that was documented on her telemetry strips  with a rate up to 120.   CONDITION ON DISCHARGE:  Stable.   FOLLOW  UP:  Follow up with Dr. Allyson Lloyd and Dr. Barbee Lloyd.   SPECIAL INSTRUCTIONS:  Take antibiotics for any dental work or surgery for  the next 6 months.      LKK/MEDQ  D:  11/09/2004  T:  11/10/2004  Job:  [id number]   cc:   Nicole Lloyd, M.D.  Fax: [phone]   Nicole Lloyd, M.D.  [address]  [address]  [place]  [place] [address]  Fax: [phone]

## 2011-04-08 NOTE — Cardiovascular Report (Signed)
Nicole Lloyd, Nicole Lloyd NO.:  [id number]   MEDICAL RECORD NO.:  [id number]          PATIENT TYPE:  INP   LOCATION:  3735                         FACILITY:  [facility]   PHYSICIAN:  Mark E. Severiano Gilbert, M.D.    DATE OF BIRTH:  01-12-1946   DATE OF PROCEDURE:  11/08/2004  DATE OF DISCHARGE:                              CARDIAC CATHETERIZATION   PROCEDURE PERFORMED:  1.  AVNRT (SVT) ablation.  2.  Comprehensive diagnostic EP study including coronary sinus pacing of the      left atrium.  3.  EP study after isoproterenol infusion.   PREPROCEDURE DIAGNOSIS:  Symptomatic PSVT with angina pectoris during  tachycardia.   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   OPERATOR:  Mark E. Severiano Gilbert, M.D.   COMPLICATIONS:  None.   ESTIMATED BLOOD LOSS:  Less than 50 mL.   MEDICATIONS INFUSED:  Isoproterenol pre and post ablation.   PROCEDURE IN DETAIL:  The patient was brought to the EP lab in fasting  state.  The patient's groins were prepped and draped in usual manner.  Right  and left femoral areas were infiltrated with 1% lidocaine for anesthesia.  Two 7-French sheaths were placed in the left femoral vein, two 7-French  sheaths were placed in the right femoral vein without difficulty.  A 5-  [place] CRD-2 catheter was advanced to the HIS position, a 5 [place] Demotta  catheter was advanced to the right ventricular apex, a 7-French Bio Webster  decapolar deflectable catheter was advanced to the coronary sinus, and  finally a Josephson quadripolar fixed curve was advanced in the high right  atrium.  Baseline measurements demonstrated normal sinus rhythm with an AH  interval of 113 msec, H-V interval 53 msec.  Block cycle length retrograde  was 550 msec.  Off the drive train 161 msec, fast-pathway ERP was 540 and  slow-pathway ERP was 430 msec.  Isoproterenol was infused at 1 mcg per  minute.  Retrograde block cycle length after isoproterenol  infusion was 300  msec with midline atrial activation.  The ventricular effective refractory  period was 270 msec off the drive train and 096 msec after isoproterenol,  retrograde ERP of the AV node was less than 270 msec.  Pacing from the  coronary sinus with double-extra atrial stimuli resulted in initiation of a  narrow-complex regular tachycardia cycle length 400 msec.  The VA time of  the tachycardia was noted to be less than 50 msec.  HIS refractory PVCs were  delivered from the right ventricular apex without preexcitation of the  atrium during tachycardia.  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  Isoproterenol was  discontinued and an EPT 4-mm tipped asymmetric 4 ablation catheter was  advanced into the right atrium and placed along the posterior tricuspid  annulus.  The annulus was mapped at several sites with deliveries of  radiofrequency energy.  After atrial mapping, slow-pathway potentials were  identified at approximately 5 o'clock on the tricuspid annulus in the right  atrium in the LAO projection.  Two  approximately 15-20 second deliveries RF  energy with temperature characteristics of approximately 55 degrees Celsius  were made with initial juncture beats initiated a tachycardia which  terminated after several seconds.  After waiting approximately 20 minutes,  EP study was repeated. Post ablation the H-V interval was noted to be 55  msec.  The AV node ERP was noted to be 380 msec off the drive train of 782  msec.  The retrograde block cycle length in the ventricle was noted to be  500 msec with midline conduction.  Isoproterenol was again infused in  attempts at induction of AV and RT were unsuccessful.  Isoproterenol was  discontinued.  All catheters removed from the body, and sheaths were removed  with hemostasis obtained by direct pressure.  The patient tolerated the  procedure well, and is returned to the holding area  in stable hemodynamic  condition.       MEP/MEDQ  D:  11/08/2004  T:  11/09/2004  Job:  [id number]

## 2011-04-08 NOTE — H&P (Signed)
NAME:  Nicole Lloyd, Nicole Lloyd                        ACCOUNT NO.:  [id number]   MEDICAL RECORD NO.:  [id number]                   PATIENT TYPE:  INP   LOCATION:  4737                                 FACILITY:  [facility]   PHYSICIAN:  Wilson Singer, M.D.             DATE OF BIRTH:  1946-04-06   DATE OF ADMISSION:  03/13/2004  DATE OF DISCHARGE:                                HISTORY & PHYSICAL   HISTORY:  This is a very pleasant, 65 year old African-American lady who has  a history of hypertension and diabetes and also a history of ischemic heart  disease, who now presents with 2 episodes of cardiac chest pain in the last  2 days.  She had one 2 days ago and this lasted approximately 20 minutes and  she had one today, approximately 9 hours ago, which lasted 20 minutes also.  The pain is central chest in nature and radiates to the back as well as to  the left arm.  It is very typical of her pain that she has had previously  with her myocardial infarction and is heavy is nature associated with  sweating and some dyspnea.  She did have a myocardial infarction in the  1980s and also another one in 2000, at which time she also had CABG.  She  has had no episode of chest pain in 2003, but the details of this are  unclear.  She has a longstanding history of diabetes and hypertension for  which she is on medications.   MEDICATIONS:  1. Glucotrol XL 5 mg daily.  2. Glucophage 1 gram b.i.d.  3. Actos 45 mg daily.  4. Toprol XL 50 mg daily.  5. Digoxin 0.25 mg daily.  6. Bextra 10 mg daily.  7. Aspirin 81 mg daily.  8. Protonix 40 mg daily.   ALLERGIES:  NONE.   SOCIAL HISTORY:  She is a nonsmoker and does not drink alcohol.  She is a  widow who lives with a friend.   FAMILY HISTORY:  Noncontributory.   REVIEW OF SYSTEMS:  Apart from the symptoms mentioned above, there are no  other symptoms referable to the CONSTITUTIONAL, CARDIOVASCULAR, RESPIRATORY,  MUSCULOSKELETAL, NEUROLOGIC,  GASTROINTESTINAL, GENITOURINARY, DERMATOLOGIC,  ENDOCRINE systems.   PHYSICAL EXAMINATION:  Blood pressure 109/53.  Pulse 66 per minute and in  sinus rhythm.  Pulse ox on room air 96%.  She is afebrile.  CARDIOVASCULAR:  Heart sounds are present and normal with no murmurs.  LUNGS:  Lung fields are clear to auscultation and percussion.  ABDOMEN:  Soft and nontender with no hepatosplenomegaly.  NEUROLOGIC:  She is alert and oriented with no focal neurologic signs.  SKIN:  Normal.  MUSCULOSKELETAL:  Within normal limits.   INVESTIGATIONS:  Electrocardiogram shows T wave inversions anterolaterally  and inferior, but there were no significant ST changes.  Sodium 135,  potassium 4.4, chloride 105, BUN 17, glucose 158, hemoglobin 12.3, white  blood cell count 12.5, platelets 221,000.  Troponin less than 0.05.  CPK  1.5.  Prothrombin time 12 seconds.  INR 0.8.   IMPRESSION AND PLAN:  1. Unstable angina, we will admit to telemetry and get serial cardiac     enzymes as well as start her on some heparin and nitroglycerin as needed.     Cardiology has been consulted and they really should take over the care     of this patient, as this is purely a cardiac problem.  2. Diabetes.  We will continue on oral hypoglycemic agents and start a     sliding scale of insulin to maintain strict control of her diabetes.  3. Hypertension.  We will follow this and monitor this carefully.   FURTHER RECOMMENDATIONS:  She will no doubt require cardiac catheterization,  so it is really crucial that the cardiologists do take over the care of the  patient and advise this patient.  However, I feel strongly that she does  indeed have unstable angina.                                                Wilson Singer, M.D.    NCG/MEDQ  D:  03/13/2004  T:  03/14/2004  Job:  [id number]   cc:   Olene Craven, M.D.  [address]  [address]  [place]  [place] [address]  Fax: [phone]

## 2011-08-16 LAB — DIFFERENTIAL
Basophils Absolute: 0
Basophils Relative: 0
Eosinophils Absolute: 0.1
Monocytes Relative: 10
Neutro Abs: 4.8
Neutrophils Relative %: 54

## 2011-08-16 LAB — TROPONIN I
Troponin I: 0.01
Troponin I: 0.01

## 2011-08-16 LAB — POCT I-STAT, CHEM 8
BUN: 12
Calcium, Ion: 1.11 — ABNORMAL LOW
Chloride: 106
Creatinine, Ser: 0.9
Sodium: 140
TCO2: 23

## 2011-08-16 LAB — COMPREHENSIVE METABOLIC PANEL
ALT: 10
AST: 15
Albumin: 3 — ABNORMAL LOW
CO2: 25
Chloride: 110
Creatinine, Ser: 0.7
GFR calc Af Amer: >60
GFR calc non Af Amer: >60
Potassium: 4
Sodium: 141
Total Bilirubin: 0.2 — ABNORMAL LOW

## 2011-08-16 LAB — CK TOTAL AND CKMB (NOT AT ARMC)
CK, MB: 1.1
Relative Index: 0.7
Relative Index: 0.8
Total CK: 128
Total CK: 147
Total CK: 184 — ABNORMAL HIGH
Total CK: 224 — ABNORMAL HIGH

## 2011-08-16 LAB — CBC
HCT: 29.4 — ABNORMAL LOW
Hemoglobin: 9.6 — ABNORMAL LOW
MCHC: 32.9
MCV: 87.9
MCV: 88.2
Platelets: 171
Platelets: 189
RBC: 3.34 — ABNORMAL LOW
RBC: 3.39 — ABNORMAL LOW
RDW: 14.2
WBC: 8.9
WBC: 9

## 2011-08-16 LAB — HEMOGLOBIN A1C: Mean Plasma Glucose: 140

## 2011-08-16 LAB — PROTIME-INR: Prothrombin Time: 12.6

## 2011-08-16 LAB — LIPID PANEL
HDL: 45
Total CHOL/HDL Ratio: 3.5

## 2011-08-16 LAB — POCT CARDIAC MARKERS
Myoglobin, poc: 54.9
Myoglobin, poc: 57.5
Operator id: 277751
Operator id: 294521
Troponin i, poc: <0.05

## 2011-08-16 LAB — BASIC METABOLIC PANEL
CO2: 28
Glucose, Bld: 117 — ABNORMAL HIGH
Potassium: 3.9
Sodium: 141

## 2011-08-16 LAB — IRON AND TIBC
Iron: 92
TIBC: 378
UIBC: 286

## 2011-08-16 LAB — RETICULOCYTES
Retic Count, Absolute: 55.1
Retic Ct Pct: 1.5

## 2011-08-16 LAB — VITAMIN B12: Vitamin B-12: 298 (ref 211–911)

## 2011-11-29 ENCOUNTER — Other Ambulatory Visit: Payer: Self-pay | Admitting: Family Medicine

## 2011-11-29 DIAGNOSIS — M79604 Pain in right leg: Secondary | ICD-10-CM

## 2011-11-30 ENCOUNTER — Other Ambulatory Visit: Payer: Self-pay | Admitting: Family Medicine

## 2011-11-30 DIAGNOSIS — M79604 Pain in right leg: Secondary | ICD-10-CM

## 2011-12-06 ENCOUNTER — Ambulatory Visit
Admission: RE | Admit: 2011-12-06 | Discharge: 2011-12-06 | Disposition: A | Discharge status: Home / Self Care | Payer: No Typology Code available for payment source | Source: Ambulatory Visit | Attending: Family Medicine | Admitting: Family Medicine

## 2011-12-06 DIAGNOSIS — M79604 Pain in right leg: Secondary | ICD-10-CM

## 2012-03-27 ENCOUNTER — Other Ambulatory Visit (HOSPITAL_COMMUNITY): Payer: Self-pay | Admitting: Family Medicine

## 2012-03-27 DIAGNOSIS — Z1231 Encounter for screening mammogram for malignant neoplasm of breast: Secondary | ICD-10-CM

## 2012-04-19 ENCOUNTER — Ambulatory Visit (HOSPITAL_COMMUNITY): Payer: Medicare HMO | Attending: Family Medicine

## 2012-04-27 ENCOUNTER — Other Ambulatory Visit (HOSPITAL_COMMUNITY): Payer: Self-pay | Admitting: Family Medicine

## 2012-05-31 ENCOUNTER — Ambulatory Visit (HOSPITAL_COMMUNITY)
Admission: RE | Admit: 2012-05-31 | Discharge: 2012-05-31 | Disposition: A | Discharge status: Home / Self Care | Payer: Medicare HMO | Source: Ambulatory Visit | Attending: Family Medicine | Admitting: Family Medicine

## 2012-05-31 DIAGNOSIS — Z1231 Encounter for screening mammogram for malignant neoplasm of breast: Secondary | ICD-10-CM | POA: Insufficient documentation

## 2012-10-01 ENCOUNTER — Other Ambulatory Visit: Payer: Self-pay | Admitting: Family Medicine

## 2012-10-01 DIAGNOSIS — Z780 Asymptomatic menopausal state: Secondary | ICD-10-CM

## 2013-01-10 ENCOUNTER — Other Ambulatory Visit: Payer: Self-pay | Admitting: Family Medicine

## 2013-01-10 DIAGNOSIS — Z1231 Encounter for screening mammogram for malignant neoplasm of breast: Secondary | ICD-10-CM

## 2013-01-10 DIAGNOSIS — Z780 Asymptomatic menopausal state: Secondary | ICD-10-CM

## 2013-08-08 ENCOUNTER — Ambulatory Visit (HOSPITAL_COMMUNITY): Payer: Medicare HMO

## 2014-08-20 DIAGNOSIS — R9431 Abnormal electrocardiogram [ECG] [EKG]: Secondary | ICD-10-CM | POA: Insufficient documentation

## 2014-08-20 DIAGNOSIS — E785 Hyperlipidemia, unspecified: Secondary | ICD-10-CM | POA: Insufficient documentation

## 2014-08-20 DIAGNOSIS — R0609 Other forms of dyspnea: Secondary | ICD-10-CM | POA: Insufficient documentation

## 2014-08-20 DIAGNOSIS — E119 Type 2 diabetes mellitus without complications: Secondary | ICD-10-CM | POA: Insufficient documentation

## 2014-08-20 DIAGNOSIS — I25709 Atherosclerosis of coronary artery bypass graft(s), unspecified, with unspecified angina pectoris: Secondary | ICD-10-CM | POA: Insufficient documentation

## 2014-08-20 DIAGNOSIS — R0989 Other specified symptoms and signs involving the circulatory and respiratory systems: Secondary | ICD-10-CM | POA: Insufficient documentation

## 2014-08-20 DIAGNOSIS — R0600 Dyspnea, unspecified: Secondary | ICD-10-CM | POA: Insufficient documentation

## 2014-08-20 DIAGNOSIS — R0789 Other chest pain: Secondary | ICD-10-CM | POA: Insufficient documentation

## 2015-07-03 DIAGNOSIS — I639 Cerebral infarction, unspecified: Secondary | ICD-10-CM | POA: Insufficient documentation

## 2015-10-03 DIAGNOSIS — N182 Chronic kidney disease, stage 2 (mild): Secondary | ICD-10-CM | POA: Insufficient documentation

## 2015-12-09 DIAGNOSIS — K529 Noninfective gastroenteritis and colitis, unspecified: Secondary | ICD-10-CM | POA: Diagnosis not present

## 2015-12-09 DIAGNOSIS — I2510 Atherosclerotic heart disease of native coronary artery without angina pectoris: Secondary | ICD-10-CM | POA: Diagnosis not present

## 2015-12-09 DIAGNOSIS — R630 Anorexia: Secondary | ICD-10-CM | POA: Diagnosis not present

## 2015-12-09 DIAGNOSIS — G4700 Insomnia, unspecified: Secondary | ICD-10-CM | POA: Diagnosis not present

## 2015-12-09 DIAGNOSIS — R1013 Epigastric pain: Secondary | ICD-10-CM | POA: Diagnosis not present

## 2015-12-09 DIAGNOSIS — Z8673 Personal history of transient ischemic attack (TIA), and cerebral infarction without residual deficits: Secondary | ICD-10-CM | POA: Diagnosis not present

## 2015-12-09 DIAGNOSIS — R634 Abnormal weight loss: Secondary | ICD-10-CM | POA: Diagnosis not present

## 2015-12-09 DIAGNOSIS — I6529 Occlusion and stenosis of unspecified carotid artery: Secondary | ICD-10-CM | POA: Diagnosis not present

## 2015-12-09 DIAGNOSIS — I10 Essential (primary) hypertension: Secondary | ICD-10-CM | POA: Diagnosis not present

## 2015-12-09 DIAGNOSIS — E119 Type 2 diabetes mellitus without complications: Secondary | ICD-10-CM | POA: Diagnosis not present

## 2015-12-16 DIAGNOSIS — N3000 Acute cystitis without hematuria: Secondary | ICD-10-CM | POA: Diagnosis not present

## 2015-12-16 DIAGNOSIS — R300 Dysuria: Secondary | ICD-10-CM | POA: Diagnosis not present

## 2016-02-08 DIAGNOSIS — I639 Cerebral infarction, unspecified: Secondary | ICD-10-CM | POA: Diagnosis not present

## 2016-02-08 DIAGNOSIS — R110 Nausea: Secondary | ICD-10-CM | POA: Diagnosis not present

## 2016-02-08 DIAGNOSIS — Z87891 Personal history of nicotine dependence: Secondary | ICD-10-CM | POA: Diagnosis not present

## 2016-02-08 DIAGNOSIS — I6503 Occlusion and stenosis of bilateral vertebral arteries: Secondary | ICD-10-CM | POA: Diagnosis not present

## 2016-02-08 DIAGNOSIS — G463 Brain stem stroke syndrome: Secondary | ICD-10-CM | POA: Diagnosis not present

## 2016-04-07 ENCOUNTER — Other Ambulatory Visit: Payer: Self-pay | Admitting: Family Medicine

## 2016-04-07 DIAGNOSIS — R634 Abnormal weight loss: Secondary | ICD-10-CM

## 2016-04-07 DIAGNOSIS — Z7901 Long term (current) use of anticoagulants: Secondary | ICD-10-CM | POA: Diagnosis not present

## 2016-04-07 DIAGNOSIS — R112 Nausea with vomiting, unspecified: Secondary | ICD-10-CM

## 2016-04-07 DIAGNOSIS — Z8719 Personal history of other diseases of the digestive system: Secondary | ICD-10-CM | POA: Diagnosis not present

## 2016-04-07 DIAGNOSIS — I2510 Atherosclerotic heart disease of native coronary artery without angina pectoris: Secondary | ICD-10-CM | POA: Diagnosis not present

## 2016-04-07 DIAGNOSIS — I672 Cerebral atherosclerosis: Secondary | ICD-10-CM | POA: Diagnosis not present

## 2016-04-07 DIAGNOSIS — R197 Diarrhea, unspecified: Secondary | ICD-10-CM | POA: Diagnosis not present

## 2016-04-07 DIAGNOSIS — R8290 Unspecified abnormal findings in urine: Secondary | ICD-10-CM | POA: Diagnosis not present

## 2016-04-07 DIAGNOSIS — Z8673 Personal history of transient ischemic attack (TIA), and cerebral infarction without residual deficits: Secondary | ICD-10-CM | POA: Diagnosis not present

## 2016-04-07 DIAGNOSIS — K8020 Calculus of gallbladder without cholecystitis without obstruction: Secondary | ICD-10-CM | POA: Diagnosis not present

## 2016-04-07 DIAGNOSIS — E119 Type 2 diabetes mellitus without complications: Secondary | ICD-10-CM | POA: Diagnosis not present

## 2016-04-08 DIAGNOSIS — H43822 Vitreomacular adhesion, left eye: Secondary | ICD-10-CM | POA: Diagnosis not present

## 2016-04-08 DIAGNOSIS — H43813 Vitreous degeneration, bilateral: Secondary | ICD-10-CM | POA: Diagnosis not present

## 2016-04-19 ENCOUNTER — Other Ambulatory Visit: Payer: Medicare HMO

## 2016-04-24 DIAGNOSIS — R110 Nausea: Secondary | ICD-10-CM | POA: Diagnosis not present

## 2016-04-26 ENCOUNTER — Encounter (HOSPITAL_COMMUNITY): Payer: Self-pay

## 2016-04-26 ENCOUNTER — Other Ambulatory Visit: Payer: Medicare HMO

## 2016-04-26 ENCOUNTER — Emergency Department (HOSPITAL_COMMUNITY): Payer: Medicare HMO

## 2016-04-26 ENCOUNTER — Emergency Department (HOSPITAL_COMMUNITY)
Admission: EM | Admit: 2016-04-26 | Discharge: 2016-04-26 | Disposition: A | Discharge status: Home / Self Care | Payer: Medicare HMO | Attending: Emergency Medicine | Admitting: Emergency Medicine

## 2016-04-26 DIAGNOSIS — I10 Essential (primary) hypertension: Secondary | ICD-10-CM | POA: Insufficient documentation

## 2016-04-26 DIAGNOSIS — K8020 Calculus of gallbladder without cholecystitis without obstruction: Secondary | ICD-10-CM | POA: Diagnosis not present

## 2016-04-26 DIAGNOSIS — I2510 Atherosclerotic heart disease of native coronary artery without angina pectoris: Secondary | ICD-10-CM | POA: Diagnosis not present

## 2016-04-26 DIAGNOSIS — E119 Type 2 diabetes mellitus without complications: Secondary | ICD-10-CM | POA: Diagnosis not present

## 2016-04-26 DIAGNOSIS — R1013 Epigastric pain: Secondary | ICD-10-CM | POA: Diagnosis not present

## 2016-04-26 DIAGNOSIS — R112 Nausea with vomiting, unspecified: Secondary | ICD-10-CM

## 2016-04-26 DIAGNOSIS — E876 Hypokalemia: Secondary | ICD-10-CM | POA: Insufficient documentation

## 2016-04-26 DIAGNOSIS — Z8673 Personal history of transient ischemic attack (TIA), and cerebral infarction without residual deficits: Secondary | ICD-10-CM | POA: Insufficient documentation

## 2016-04-26 DIAGNOSIS — R634 Abnormal weight loss: Secondary | ICD-10-CM | POA: Diagnosis not present

## 2016-04-26 DIAGNOSIS — R630 Anorexia: Secondary | ICD-10-CM

## 2016-04-26 DIAGNOSIS — R8290 Unspecified abnormal findings in urine: Secondary | ICD-10-CM | POA: Diagnosis not present

## 2016-04-26 DIAGNOSIS — N390 Urinary tract infection, site not specified: Secondary | ICD-10-CM | POA: Diagnosis not present

## 2016-04-26 HISTORY — DX: Essential (primary) hypertension: I10

## 2016-04-26 HISTORY — DX: Calculus of gallbladder without cholecystitis without obstruction: K80.20

## 2016-04-26 HISTORY — DX: Atherosclerotic heart disease of native coronary artery without angina pectoris: I25.10

## 2016-04-26 HISTORY — DX: Type 2 diabetes mellitus without complications: E11.9

## 2016-04-26 HISTORY — DX: Cerebral infarction, unspecified: I63.9

## 2016-04-26 LAB — URINALYSIS, ROUTINE W REFLEX MICROSCOPIC
Bilirubin Urine: NEGATIVE
Glucose, UA: NEGATIVE mg/dL
Ketones, ur: 15 mg/dL — AB
LEUKOCYTES UA: NEGATIVE
NITRITE: NEGATIVE
PH: 8 (ref 5.0–8.0)
Protein, ur: 100 mg/dL — AB
SPECIFIC GRAVITY, URINE: 1.02 (ref 1.005–1.030)

## 2016-04-26 LAB — CBC
HEMATOCRIT: 35.6 % — AB (ref 36.0–46.0)
HEMOGLOBIN: 11.6 g/dL — AB (ref 12.0–15.0)
MCH: 29.3 pg (ref 26.0–34.0)
MCHC: 32.6 g/dL (ref 30.0–36.0)
MCV: 89.9 fL (ref 78.0–100.0)
Platelets: 208 10*3/uL (ref 150–400)
RBC: 3.96 MIL/uL (ref 3.87–5.11)
RDW: 13.9 % (ref 11.5–15.5)
WBC: 7.1 10*3/uL (ref 4.0–10.5)

## 2016-04-26 LAB — URINE MICROSCOPIC-ADD ON

## 2016-04-26 LAB — COMPREHENSIVE METABOLIC PANEL
ALT: 11 U/L — AB (ref 14–54)
ANION GAP: 11 (ref 5–15)
AST: 24 U/L (ref 15–41)
Albumin: 3.5 g/dL (ref 3.5–5.0)
Alkaline Phosphatase: 32 U/L — ABNORMAL LOW (ref 38–126)
BUN: 6 mg/dL (ref 6–20)
CALCIUM: 9 mg/dL (ref 8.9–10.3)
CO2: 27 mmol/L (ref 22–32)
Chloride: 99 mmol/L — ABNORMAL LOW (ref 101–111)
Creatinine, Ser: 1.06 mg/dL — ABNORMAL HIGH (ref 0.44–1.00)
GFR, EST AFRICAN AMERICAN: 60 mL/min — AB (ref >60)
GFR, EST NON AFRICAN AMERICAN: 52 mL/min — AB (ref >60)
GLUCOSE: 87 mg/dL (ref 65–99)
POTASSIUM: 3.1 mmol/L — AB (ref 3.5–5.1)
SODIUM: 137 mmol/L (ref 135–145)
TOTAL PROTEIN: 5.9 g/dL — AB (ref 6.5–8.1)
Total Bilirubin: 1.1 mg/dL (ref 0.3–1.2)

## 2016-04-26 LAB — LIPASE, BLOOD: LIPASE: 15 U/L (ref 11–51)

## 2016-04-26 MED ORDER — IOPAMIDOL (ISOVUE-300) INJECTION 61%
INTRAVENOUS | Status: AC
  Administered 2016-04-26: 80 mL
  Filled 2016-04-26: qty 100

## 2016-04-26 MED ORDER — ONDANSETRON 4 MG PO TBDP
ORAL_TABLET | ORAL | Status: AC
  Filled 2016-04-26: qty 1

## 2016-04-26 MED ORDER — POTASSIUM CHLORIDE 10 MEQ/100ML IV SOLN
10.0000 meq | Freq: Once | INTRAVENOUS | Status: AC
  Administered 2016-04-26: 10 meq via INTRAVENOUS
  Filled 2016-04-26: qty 100

## 2016-04-26 MED ORDER — SODIUM CHLORIDE 0.9 % IV SOLN
1000.0000 mL | Freq: Once | INTRAVENOUS | Status: AC
  Administered 2016-04-26: 1000 mL via INTRAVENOUS

## 2016-04-26 MED ORDER — ONDANSETRON HCL 4 MG/2ML IJ SOLN
4.0000 mg | Freq: Once | INTRAMUSCULAR | Status: DC
  Filled 2016-04-26: qty 2

## 2016-04-26 MED ORDER — ONDANSETRON 4 MG PO TBDP
4.0000 mg | ORAL_TABLET | Freq: Once | ORAL | Status: AC | PRN
  Administered 2016-04-26: 4 mg via ORAL

## 2016-04-26 MED ORDER — ONDANSETRON HCL 4 MG PO TABS: 4.0000 mg | ORAL_TABLET | Freq: Four times a day (QID) | ORAL | Status: DC | PRN

## 2016-04-26 MED ORDER — SODIUM CHLORIDE 0.9 % IV SOLN
1000.0000 mL | INTRAVENOUS | Status: DC
  Administered 2016-04-26: 1000 mL via INTRAVENOUS

## 2016-04-26 NOTE — ED Notes (Signed)
Pt ambulated to restroom unassisted with steady gait

## 2016-04-26 NOTE — Discharge Instructions (Signed)
Go for your endoscopy tomorrow as scheduled. Follow up with your primary care provider to work with you regarding your appetite and weight loss. Tried taking the ondansetron (Zofran) about 30 minutes to 1 hour before meal to see if it helps you hold it down. Tried to keep herself hydrated-drink small amounts frequently.

## 2016-04-26 NOTE — ED Notes (Signed)
Pt.; having n/v/d for 1 month She reports that she was sent to [place] for Fluids.  Pt. Also having abdominal pain. She reports that the stool is loose and it is dark. Pt. Is alert and oriented X4. Skin is warm and dry.

## 2016-04-26 NOTE — ED Provider Notes (Signed)
CSN: [id number]     Arrival date & time 04/26/16  1435 History   First MD Initiated Contact with Patient 04/26/16 1737     Chief Complaint  Patient presents with  . Emesis     (Consider location/radiation/quality/duration/timing/severity/associated sxs/prior Treatment) Patient is a 70 y.o. female presenting with vomiting. The history is provided by the patient.  Emesis She has been having anorexia for about 6 months with a proximally 50 pound weight loss. When she does eat, she will frequently vomit or have diarrhea following eating. She relates that smells of certain foods gets her sick. Family is concerned about dehydration. She is having mild epigastric pain. She denies fever or chills. She is approximately one year post stroke which was manifested by gait disturbance. 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  Past Medical History  Diagnosis Date  . Hypertension   . Coronary artery disease   . Diabetes mellitus without complication ([place])   . Gall stones   . Stroke [facility])    History reviewed. No pertinent past surgical history. No family history on file. Social History  Substance Use Topics  . Smoking status: Never Smoker   . Smokeless tobacco: None  . Alcohol Use: No   OB History    No data available     Review of Systems  Gastrointestinal: Positive for vomiting.  All other systems reviewed and are negative.     Allergies  Review of patient's allergies indicates not on file.  Home Medications   Prior to Admission medications   Not on File   BP 99/66 mmHg  Pulse 80  Temp(Src) 98.2 F (36.8 C) (Oral)  Resp 17  Ht 5\' 5"  (1.651 m)  Wt 131 lb (59.421 kg)  BMI 21.80 kg/m2  SpO2 100% Physical Exam  Nursing note and vitals reviewed.  70 year old female, resting comfortably and in no acute distress. Vital signs are normal. Oxygen saturation is 100%, which is normal. Head is normocephalic and atraumatic. PERRLA, EOMI. Oropharynx is clear. Neck is nontender and  supple without adenopathy or JVD. Back is nontender and there is no CVA tenderness. Lungs are clear without rales, wheezes, or rhonchi. Chest is nontender. Heart has regular rate and rhythm without murmur. Abdomen is soft, flat, with mild epigastric tenderness. There are no masses or hepatosplenomegaly and peristalsis is slightly hypomoactive. Extremities have no cyanosis or edema, full range of motion is present. Skin is warm and dry without rash. Neurologic: She is awake and alert. Speech as marked by stuttering with some perseveration but she is able to communicate effectively and understands well. Cranial nerves are intact, there are no motor or sensory deficits. Gait is not tested.  ED Course  Procedures (including critical care time) Labs Review Results for orders placed or performed during the hospital encounter of 04/26/16  Lipase, blood  Result Value Ref Range   Lipase 15 11 - 51 U/L  Comprehensive metabolic panel  Result Value Ref Range   Sodium 137 135 - 145 mmol/L   Potassium 3.1 (L) 3.5 - 5.1 mmol/L   Chloride 99 (L) 101 - 111 mmol/L   CO2 27 22 - 32 mmol/L   Glucose, Bld 87 65 - 99 mg/dL   BUN 6 6 - 20 mg/dL   Creatinine, Ser 1.06 (H) 0.44 - 1.00 mg/dL   Calcium 9.0 8.9 - 10.3 mg/dL   Total Protein 5.9 (L) 6.5 - 8.1 g/dL   Albumin 3.5 3.5 - 5.0 g/dL   AST 24  15 - 41 U/L   ALT 11 (L) 14 - 54 U/L   Alkaline Phosphatase 32 (L) 38 - 126 U/L   Total Bilirubin 1.1 0.3 - 1.2 mg/dL   GFR calc non Af Amer 52 (L) >60 mL/min   GFR calc Af Amer 60 (L) >60 mL/min   Anion gap 11 5 - 15  CBC  Result Value Ref Range   WBC 7.1 4.0 - 10.5 K/uL   RBC 3.96 3.87 - 5.11 MIL/uL   Hemoglobin 11.6 (L) 12.0 - 15.0 g/dL   HCT 35.6 (L) 36.0 - 46.0 %   MCV 89.9 78.0 - 100.0 fL   MCH 29.3 26.0 - 34.0 pg   MCHC 32.6 30.0 - 36.0 g/dL   RDW 13.9 11.5 - 15.5 %   Platelets 208 150 - 400 K/uL  Urinalysis, Routine w reflex microscopic  Result Value Ref Range   Color, Urine YELLOW YELLOW    APPearance CLEAR CLEAR   Specific Gravity, Urine 1.020 1.005 - 1.030   pH 8.0 5.0 - 8.0   Glucose, UA NEGATIVE NEGATIVE mg/dL   Hgb urine dipstick SMALL (A) NEGATIVE   Bilirubin Urine NEGATIVE NEGATIVE   Ketones, ur 15 (A) NEGATIVE mg/dL   Protein, ur 100 (A) NEGATIVE mg/dL   Nitrite NEGATIVE NEGATIVE   Leukocytes, UA NEGATIVE NEGATIVE  Urine microscopic-add on  Result Value Ref Range   Squamous Epithelial / LPF 0-5 (A) NONE SEEN   WBC, UA 0-5 0 - 5 WBC/hpf   RBC / HPF 0-5 0 - 5 RBC/hpf   Bacteria, UA RARE (A) NONE SEEN   Urine-Other MUCOUS PRESENT    Imaging Review Ct Abdomen Pelvis W Contrast  04/26/2016  CLINICAL DATA:  Weight loss, dark and loose stool.  Hysterectomy. EXAM: CT ABDOMEN AND PELVIS WITH CONTRAST TECHNIQUE: Multidetector CT imaging of the abdomen and pelvis was performed using the standard protocol following bolus administration of intravenous contrast. CONTRAST:  69mL ISOVUE-300 IOPAMIDOL (ISOVUE-300) INJECTION 61% COMPARISON:  None. FINDINGS: Lower chest: Lung bases are clear. Hepatobiliary: No focal hepatic lesion. Several round gallstones. No gallbladder inflammation. Common bile duct normal. Pancreas: With Spleen: Normal spleen Adrenals/urinary tract: Adrenal glands and kidneys are normal. The ureters and bladder normal. Stomach/Bowel: Stomach, small-bowel and cecum are normal. The appendix is not identified but there is no pericecal inflammation to suggest appendicitis. The colon and rectosigmoid colon are normal. Vascular/Lymphatic: Abdominal aorta is normal caliber with atherosclerotic calcification. There is no retroperitoneal or periportal lymphadenopathy. No pelvic lymphadenopathy. Reproductive: Post hysterectomy. Other: No free fluid. Musculoskeletal: No aggressive osseous lesion. IMPRESSION: 1. No acute findings in the abdomen pelvis. 2. No abnormality of bowel identified. 3. Cholelithiasis without cholecystitis. Electronically Signed   By: Suzy Bouchard M.D.   On:  04/26/2016 19:33   I have personally reviewed and evaluated these images and lab results as part of my medical decision-making.   MDM   Final diagnoses:  Anorexia  Weight loss, non-intentional  Epigastric pain  Non-intractable vomiting with nausea, vomiting of unspecified type  Calculus of gallbladder without cholecystitis without obstruction  Hypokalemia    Anorexia with weight loss. Motor workup is rather unremarkable other than hypokalemia. BUN and creatinine did not suggest dehydration and she is not clinically dehydrated. However, she will be given IV hydration and IV potassium. I'm concerned about her weight loss and anorexia with some abdominal complaints and she will be sent for CT of abdomen and pelvis. Old records were reviewed and her stroke record was  on care everywhere. Stroke was in the pons and was treated at [facility].  CT scan shows cholelithiasis without evidence of cholecystitis. There is no elevation of WBC are transaminases, so no indication of actual biliary colic. She feels somewhat better after IV hydration and IV potassium. She apparently is scheduled for an endoscopy tomorrow and she is to follow through with that. She feels better after IV ondansetron. I have suggested to her that she may try taking ondansetron before meals to see if it helps her hold food down. She is given a new prescription for 30 tablets. Otherwise, follow-up with PCP.  Delora Fuel, MD [id number]

## 2016-04-27 DIAGNOSIS — R197 Diarrhea, unspecified: Secondary | ICD-10-CM | POA: Diagnosis not present

## 2016-04-27 DIAGNOSIS — R1030 Lower abdominal pain, unspecified: Secondary | ICD-10-CM | POA: Diagnosis not present

## 2016-04-27 DIAGNOSIS — R112 Nausea with vomiting, unspecified: Secondary | ICD-10-CM | POA: Diagnosis not present

## 2016-04-27 DIAGNOSIS — R634 Abnormal weight loss: Secondary | ICD-10-CM | POA: Diagnosis not present

## 2016-04-27 DIAGNOSIS — Z8673 Personal history of transient ischemic attack (TIA), and cerebral infarction without residual deficits: Secondary | ICD-10-CM | POA: Diagnosis not present

## 2016-04-27 DIAGNOSIS — K8020 Calculus of gallbladder without cholecystitis without obstruction: Secondary | ICD-10-CM | POA: Diagnosis not present

## 2016-05-02 DIAGNOSIS — E7800 Pure hypercholesterolemia, unspecified: Secondary | ICD-10-CM | POA: Diagnosis not present

## 2016-05-02 DIAGNOSIS — E119 Type 2 diabetes mellitus without complications: Secondary | ICD-10-CM | POA: Diagnosis not present

## 2016-05-02 DIAGNOSIS — R079 Chest pain, unspecified: Secondary | ICD-10-CM | POA: Diagnosis not present

## 2016-05-02 DIAGNOSIS — I2510 Atherosclerotic heart disease of native coronary artery without angina pectoris: Secondary | ICD-10-CM | POA: Diagnosis not present

## 2016-05-02 DIAGNOSIS — I10 Essential (primary) hypertension: Secondary | ICD-10-CM | POA: Diagnosis not present

## 2016-05-02 DIAGNOSIS — H2512 Age-related nuclear cataract, left eye: Secondary | ICD-10-CM | POA: Diagnosis not present

## 2016-05-07 ENCOUNTER — Emergency Department (HOSPITAL_COMMUNITY): Payer: Medicare HMO

## 2016-05-07 ENCOUNTER — Encounter (HOSPITAL_COMMUNITY): Payer: Self-pay | Admitting: *Deleted

## 2016-05-07 ENCOUNTER — Emergency Department (HOSPITAL_COMMUNITY)
Admission: EM | Admit: 2016-05-07 | Discharge: 2016-05-08 | Disposition: A | Discharge status: Home / Self Care | Payer: Medicare HMO | Attending: Emergency Medicine | Admitting: Emergency Medicine

## 2016-05-07 DIAGNOSIS — I2510 Atherosclerotic heart disease of native coronary artery without angina pectoris: Secondary | ICD-10-CM | POA: Insufficient documentation

## 2016-05-07 DIAGNOSIS — Z79899 Other long term (current) drug therapy: Secondary | ICD-10-CM | POA: Diagnosis not present

## 2016-05-07 DIAGNOSIS — R1030 Lower abdominal pain, unspecified: Secondary | ICD-10-CM | POA: Diagnosis not present

## 2016-05-07 DIAGNOSIS — R112 Nausea with vomiting, unspecified: Secondary | ICD-10-CM | POA: Diagnosis not present

## 2016-05-07 DIAGNOSIS — Z7982 Long term (current) use of aspirin: Secondary | ICD-10-CM | POA: Insufficient documentation

## 2016-05-07 DIAGNOSIS — I10 Essential (primary) hypertension: Secondary | ICD-10-CM | POA: Diagnosis not present

## 2016-05-07 DIAGNOSIS — E119 Type 2 diabetes mellitus without complications: Secondary | ICD-10-CM | POA: Insufficient documentation

## 2016-05-07 DIAGNOSIS — R197 Diarrhea, unspecified: Secondary | ICD-10-CM | POA: Insufficient documentation

## 2016-05-07 DIAGNOSIS — K8020 Calculus of gallbladder without cholecystitis without obstruction: Secondary | ICD-10-CM | POA: Diagnosis not present

## 2016-05-07 DIAGNOSIS — Z7984 Long term (current) use of oral hypoglycemic drugs: Secondary | ICD-10-CM | POA: Insufficient documentation

## 2016-05-07 LAB — URINALYSIS, ROUTINE W REFLEX MICROSCOPIC
GLUCOSE, UA: NEGATIVE mg/dL
Hgb urine dipstick: NEGATIVE
KETONES UR: 40 mg/dL — AB
Leukocytes, UA: NEGATIVE
NITRITE: NEGATIVE
PH: 7 (ref 5.0–8.0)
Protein, ur: 100 mg/dL — AB
SPECIFIC GRAVITY, URINE: 1.023 (ref 1.005–1.030)

## 2016-05-07 LAB — COMPREHENSIVE METABOLIC PANEL
ALBUMIN: 4.1 g/dL (ref 3.5–5.0)
ALT: 10 U/L — ABNORMAL LOW (ref 14–54)
ANION GAP: 16 — AB (ref 5–15)
AST: 19 U/L (ref 15–41)
Alkaline Phosphatase: 37 U/L — ABNORMAL LOW (ref 38–126)
BILIRUBIN TOTAL: 1 mg/dL (ref 0.3–1.2)
BUN: 10 mg/dL (ref 6–20)
CHLORIDE: 95 mmol/L — AB (ref 101–111)
CO2: 24 mmol/L (ref 22–32)
Calcium: 9.8 mg/dL (ref 8.9–10.3)
Creatinine, Ser: 1.15 mg/dL — ABNORMAL HIGH (ref 0.44–1.00)
GFR calc Af Amer: 54 mL/min — ABNORMAL LOW (ref >60)
GFR calc non Af Amer: 47 mL/min — ABNORMAL LOW (ref >60)
GLUCOSE: 162 mg/dL — AB (ref 65–99)
POTASSIUM: 3.5 mmol/L (ref 3.5–5.1)
SODIUM: 135 mmol/L (ref 135–145)
Total Protein: 7.2 g/dL (ref 6.5–8.1)

## 2016-05-07 LAB — CBC
HEMATOCRIT: 37.3 % (ref 36.0–46.0)
HEMOGLOBIN: 12.9 g/dL (ref 12.0–15.0)
MCH: 30.6 pg (ref 26.0–34.0)
MCHC: 34.6 g/dL (ref 30.0–36.0)
MCV: 88.6 fL (ref 78.0–100.0)
Platelets: 228 10*3/uL (ref 150–400)
RBC: 4.21 MIL/uL (ref 3.87–5.11)
RDW: 14 % (ref 11.5–15.5)
WBC: 7.8 10*3/uL (ref 4.0–10.5)

## 2016-05-07 LAB — URINE MICROSCOPIC-ADD ON: RBC / HPF: NONE SEEN RBC/hpf (ref 0–5)

## 2016-05-07 LAB — LIPASE, BLOOD: Lipase: 17 U/L (ref 11–51)

## 2016-05-07 MED ORDER — SODIUM CHLORIDE 0.9 % IV BOLUS (SEPSIS)
1000.0000 mL | Freq: Once | INTRAVENOUS | Status: AC
  Administered 2016-05-07: 1000 mL via INTRAVENOUS

## 2016-05-07 MED ORDER — ONDANSETRON 4 MG PO TBDP: 4.0000 mg | ORAL_TABLET | Freq: Once | ORAL | Status: DC | PRN

## 2016-05-07 MED ORDER — ONDANSETRON HCL 4 MG/2ML IJ SOLN
4.0000 mg | Freq: Once | INTRAMUSCULAR | Status: AC
  Administered 2016-05-07: 4 mg via INTRAVENOUS
  Filled 2016-05-07: qty 2

## 2016-05-07 MED ORDER — GI COCKTAIL ~~LOC~~
30.0000 mL | Freq: Once | ORAL | Status: AC
  Administered 2016-05-07: 30 mL via ORAL
  Filled 2016-05-07: qty 30

## 2016-05-07 NOTE — ED Notes (Signed)
PT could not urinate PT states she has not been able to eat solid food in close to a month, PT also says she had a protein shake Friday 6/16, Pt states she usually loves water but has not wanted to drink even water in a couple of days.

## 2016-05-07 NOTE — ED Provider Notes (Signed)
CSN: [id number]     Arrival date & time 05/07/16  1858 History   First MD Initiated Contact with Patient 05/07/16 2036     Chief Complaint  Patient presents with  . Abdominal Pain  . Emesis    (Consider location/radiation/quality/duration/timing/severity/associated sxs/prior Treatment) HPI Comments: 70 year old female with a history of hypertension, coronary artery disease s/p CABG, DM, pons CVA treated at [facility], and gallstones presents to the emergency department for evaluation of nausea and vomiting. Patient states that symptoms have been intermittent and worsening March 2017. Over this time, she has lost 50-60 pounds because of her inability to adequately eat and drink. Symptoms have been associated with a burning epigastric pain as well as a cramping sensation in her lower abdomen. These pains wax and wane in severity. Patient has been taking nausea medication for emesis management without relief. She has also had sporadic episodes of watery diarrhea. She states that her emesis has turned from off white to slightly brown in color. She denies any gross blood. She has also had no melena or hematochezia.  She was seen and evaluated for similar symptoms on 04/26/2016. Labs at this time revealed cholelithiasis without cholecystitis. Patient also had some hypokalemia treated with IV potassium. She has since been referred to gastroenterology and seen by [facility]. Patient is scheduled for an endoscopy and colonoscopy on the 27th. These were postponed in order to allow her time for cardiology clearance to have both procedures performed on the same day under anesthesia. Daughter reports that there may be planned for cholecystectomy in the future.  PCP - Dr. Chapman Fitch  Patient is a 71 y.o. female presenting with abdominal pain and vomiting. The history is provided by the patient. No language interpreter was used.  Abdominal Pain Associated symptoms: diarrhea, nausea and vomiting   Associated symptoms: no fever  and no shortness of breath   Emesis Associated symptoms: abdominal pain and diarrhea     Past Medical History  Diagnosis Date  . Hypertension   . Coronary artery disease   . Diabetes mellitus without complication ([place])   . Gall stones   . Stroke [facility])    History reviewed. No pertinent past surgical history. No family history on file. Social History  Substance Use Topics  . Smoking status: Never Smoker   . Smokeless tobacco: None  . Alcohol Use: No   OB History    No data available      Review of Systems  Constitutional: Negative for fever.  Respiratory: Negative for shortness of breath.   Gastrointestinal: Positive for nausea, vomiting, abdominal pain and diarrhea.  All other systems reviewed and are negative.   Allergies  Review of patient's allergies indicates no known allergies.  Home Medications   Prior to Admission medications   Medication Sig Start Date End Date Taking? Authorizing Provider  alendronate (FOSAMAX) 70 MG tablet Take 70 mg by mouth once a week. Take with a full glass of water on an empty stomach.    Historical Provider, MD  aspirin 81 MG tablet Take 81 mg by mouth daily.    Historical Provider, MD  dipyridamole-aspirin (AGGRENOX) 200-25 MG 12hr capsule Take 1 capsule by mouth 2 (two) times daily.    Historical Provider, MD  hydrOXYzine (ATARAX/VISTARIL) 25 MG tablet Take 25 mg by mouth at bedtime as needed. For sleep    Historical Provider, MD  metFORMIN (GLUCOPHAGE) 1000 MG tablet Take 1,000 mg by mouth 2 (two) times daily with a meal.    Historical  Provider, MD  metoprolol (LOPRESSOR) 50 MG tablet Take 25 mg by mouth 2 (two) times daily.    Historical Provider, MD  ondansetron (ZOFRAN) 4 MG tablet Take 1 tablet (4 mg total) by mouth every 6 (six) hours as needed for nausea or vomiting. [id number]   Delora Fuel, MD  pantoprazole (PROTONIX) 40 MG tablet Take 40 mg by mouth daily.    Historical Provider, MD  promethazine (PHENERGAN) 12.5 MG tablet Take  12.5 mg by mouth every 6 (six) hours as needed for nausea or vomiting.    Historical Provider, MD  ranitidine (ZANTAC) 150 MG capsule Take 150 mg by mouth 2 (two) times daily.    Historical Provider, MD  rosuvastatin (CRESTOR) 40 MG tablet Take 40 mg by mouth daily.    Historical Provider, MD   BP 144/98 mmHg  Pulse 107  Temp(Src) 97.9 F (36.6 C) (Oral)  Resp 18  SpO2 100%   Physical Exam  Constitutional: She is oriented to person, place, and time. She appears well-developed and well-nourished. No distress.  Nontoxic appearing  HENT:  Head: Normocephalic and atraumatic.  Eyes: Conjunctivae and EOM are normal. No scleral icterus.  Neck: Normal range of motion.  Cardiovascular: Regular rhythm and intact distal pulses.   Mild tachycardia  Pulmonary/Chest: Effort normal and breath sounds normal. No respiratory distress. She has no wheezes. She has no rales.  Lungs CTAB. Respirations even and unlabored  Abdominal: Soft. She exhibits no distension. There is no rebound and no guarding.  No appreciable focal TTP. No masses. Negative Murphy's sign. No peritoneal signs.  Musculoskeletal: Normal range of motion.  Neurological: She is alert and oriented to person, place, and time.  GCS 15. Patient moving all extremities.  Skin: Skin is warm and dry. No rash noted. She is not diaphoretic. No erythema. No pallor.  Psychiatric: She has a normal mood and affect. Her behavior is normal.  Nursing note and vitals reviewed.   ED Course  Procedures (including critical care time) Labs Review Labs Reviewed  COMPREHENSIVE METABOLIC PANEL - Abnormal; Notable for the following:    Chloride 95 (*)    Glucose, Bld 162 (*)    Creatinine, Ser 1.15 (*)    ALT 10 (*)    Alkaline Phosphatase 37 (*)    GFR calc non Af Amer 47 (*)    GFR calc Af Amer 54 (*)    Anion gap 16 (*)    All other components within normal limits  URINALYSIS, ROUTINE W REFLEX MICROSCOPIC (NOT AT [facility]) - Abnormal; Notable for the  following:    APPearance CLOUDY (*)    Bilirubin Urine SMALL (*)    Ketones, ur 40 (*)    Protein, ur 100 (*)    All other components within normal limits  URINE MICROSCOPIC-ADD ON - Abnormal; Notable for the following:    Squamous Epithelial / LPF 0-5 (*)    Bacteria, UA FEW (*)    Casts HYALINE CASTS (*)    All other components within normal limits  LIPASE, BLOOD  CBC    Imaging Review US Abdomen Limited  05/07/2016  CLINICAL DATA:  Right upper quadrant pain, nausea and vomiting. History of hypertension and diabetes. EXAM: US ABDOMEN LIMITED - RIGHT UPPER QUADRANT COMPARISON:  CT abdomen and pelvis 04/26/2016 FINDINGS: Gallbladder: Cholelithiasis with several stones in the gallbladder. The stones appear to be mobile. Largest measures 1.6 cm diameter. No gallbladder wall thickening, edema, or sludge. Murphy's sign is negative. Common bile duct: Diameter:  4.4 mm, normal Liver: No focal lesion identified. Within normal limits in parenchymal echogenicity. IMPRESSION: Cholelithiasis.  No additional changes to suggest cholecystitis. Electronically Signed   By: Lucienne Capers M.D.   On: 05/07/2016 22:54   I have personally reviewed and evaluated these images and lab results as part of my medical decision-making.   EKG Interpretation None      MDM   Final diagnoses:  Non-intractable vomiting with nausea, vomiting of unspecified type    70 year old female presents to the emergency department for evaluation of nausea and vomiting. Symptoms have been worsening since March 2017. She was seen for similar symptoms in the emergency department on 04/26/2016. Patient denies any distinct changes from her previous evaluation, though she does report that it has been difficult for her to tolerate food and fluids without vomiting. She was noted to have gallstones on her review his CT scan. She has no signs of obstruction today. Ultrasound performed which shows persistent cholelithiasis without evidence  of cholecystitis. Patient also has no fever, leukocytosis, or other concerning findings on laboratory workup.  Symptoms have been managed with fluids as well as antiemetics, GI cocktail and protonix. I do not see an indication for admission at this time. Patient has been able to tolerate fluids and applesauce while in the ED this evening. She has appropriate follow-up with gastroenterology as well as her primary care doctor and is scheduled for an endoscopy and colonoscopy on 05/17/2016. I have encouraged the patient to continue with her close outpatient follow-up. Prescriptions given for protonix and Carafate. Return precautions discussed and provided. Patient and daughter are agreeable to plan with no unaddressed concerns. Patient discharged in satisfactory condition.    Antonietta Breach, PA-C 05/09/16 0518  Harvel Quale, MD 05/11/16 [phone]

## 2016-05-07 NOTE — ED Notes (Signed)
Pt reports having episodes of vomiting continuously for the last 2 weeks.

## 2016-05-07 NOTE — ED Notes (Signed)
Ultrasound tech at bedside. Pt reports improvement in nausea but no change in pain.

## 2016-05-07 NOTE — ED Notes (Signed)
Pt complains of lower abdominal pain, nausea, vomiting and diarrhea. Pt states she has thrown up every hour today and has been unable to eat today. Pt has hx of gallstones.

## 2016-05-07 NOTE — ED Notes (Signed)
Family at bedside with pt. Currently awaiting disposition.

## 2016-05-08 DIAGNOSIS — Z79899 Other long term (current) drug therapy: Secondary | ICD-10-CM | POA: Diagnosis not present

## 2016-05-08 DIAGNOSIS — Z7982 Long term (current) use of aspirin: Secondary | ICD-10-CM | POA: Diagnosis not present

## 2016-05-08 DIAGNOSIS — R1030 Lower abdominal pain, unspecified: Secondary | ICD-10-CM | POA: Diagnosis not present

## 2016-05-08 DIAGNOSIS — R112 Nausea with vomiting, unspecified: Secondary | ICD-10-CM | POA: Diagnosis not present

## 2016-05-08 DIAGNOSIS — R197 Diarrhea, unspecified: Secondary | ICD-10-CM | POA: Diagnosis not present

## 2016-05-08 DIAGNOSIS — Z7984 Long term (current) use of oral hypoglycemic drugs: Secondary | ICD-10-CM | POA: Diagnosis not present

## 2016-05-08 DIAGNOSIS — E119 Type 2 diabetes mellitus without complications: Secondary | ICD-10-CM | POA: Diagnosis not present

## 2016-05-08 DIAGNOSIS — I2510 Atherosclerotic heart disease of native coronary artery without angina pectoris: Secondary | ICD-10-CM | POA: Diagnosis not present

## 2016-05-08 DIAGNOSIS — I10 Essential (primary) hypertension: Secondary | ICD-10-CM | POA: Diagnosis not present

## 2016-05-08 MED ORDER — PANTOPRAZOLE SODIUM 40 MG PO TBEC: 40.0000 mg | DELAYED_RELEASE_TABLET | Freq: Every day | ORAL | Status: DC

## 2016-05-08 MED ORDER — PANTOPRAZOLE SODIUM 40 MG PO TBEC
40.0000 mg | DELAYED_RELEASE_TABLET | Freq: Once | ORAL | Status: AC
Start: 2016-05-08 — End: 2016-05-08
  Administered 2016-05-08: 40 mg via ORAL
  Filled 2016-05-08: qty 1

## 2016-05-08 MED ORDER — SUCRALFATE 1 G PO TABS
1.0000 g | ORAL_TABLET | Freq: Three times a day (TID) | ORAL | Status: DC
Start: 2016-05-08 — End: 2017-02-09

## 2016-05-08 NOTE — ED Notes (Signed)
PT reporting improvement in nausea at this time. Pt to attempt eating applesauce

## 2016-05-08 NOTE — Discharge Instructions (Signed)
Nausea and Vomiting °Nausea is a sick feeling that often comes before throwing up (vomiting). Vomiting is a reflex where stomach contents come out of your mouth. Vomiting can cause severe loss of body fluids (dehydration). Children and elderly adults can become dehydrated quickly, especially if they also have diarrhea. Nausea and vomiting are symptoms of a condition or disease. It is important to find the cause of your symptoms. °CAUSES  °· Direct irritation of the stomach lining. This irritation can result from increased acid production (gastroesophageal reflux disease), infection, food poisoning, taking certain medicines (such as nonsteroidal anti-inflammatory drugs), alcohol use, or tobacco use. °· Signals from the brain. These signals could be caused by a headache, heat exposure, an inner ear disturbance, increased pressure in the brain from injury, infection, a tumor, or a concussion, pain, emotional stimulus, or metabolic problems. °· An obstruction in the gastrointestinal tract (bowel obstruction). °· Illnesses such as diabetes, hepatitis, gallbladder problems, appendicitis, kidney problems, cancer, sepsis, atypical symptoms of a heart attack, or eating disorders. °· Medical treatments such as chemotherapy and radiation. °· Receiving medicine that makes you sleep (general anesthetic) during surgery. °DIAGNOSIS °Your caregiver may ask for tests to be done if the problems do not improve after a few days. Tests may also be done if symptoms are severe or if the reason for the nausea and vomiting is not clear. Tests may include: °· Urine tests. °· Blood tests. °· Stool tests. °· Cultures (to look for evidence of infection). °· X-rays or other imaging studies. °Test results can help your caregiver make decisions about treatment or the need for additional tests. °TREATMENT °You need to stay well hydrated. Drink frequently but in small amounts. You may wish to drink water, sports drinks, clear broth, or eat frozen  ice pops or gelatin dessert to help stay hydrated. When you eat, eating slowly may help prevent nausea. There are also some antinausea medicines that may help prevent nausea. °HOME CARE INSTRUCTIONS  °· Take all medicine as directed by your caregiver. °· If you do not have an appetite, do not force yourself to eat. However, you must continue to drink fluids. °· If you have an appetite, eat a normal diet unless your caregiver tells you differently. °¨ Eat a variety of complex carbohydrates (rice, wheat, potatoes, bread), lean meats, yogurt, fruits, and vegetables. °¨ Avoid high-fat foods because they are more difficult to digest. °· Drink enough water and fluids to keep your urine clear or pale yellow. °· If you are dehydrated, ask your caregiver for specific rehydration instructions. Signs of dehydration may include: °¨ Severe thirst. °¨ Dry lips and mouth. °¨ Dizziness. °¨ Dark urine. °¨ Decreasing urine frequency and amount. °¨ Confusion. °¨ Rapid breathing or pulse. °SEEK IMMEDIATE MEDICAL CARE IF:  °· You have blood or brown flecks (like coffee grounds) in your vomit. °· You have black or bloody stools. °· You have a severe headache or stiff neck. °· You are confused. °· You have severe abdominal pain. °· You have chest pain or trouble breathing. °· You do not urinate at least once every 8 hours. °· You develop cold or clammy skin. °· You continue to vomit for longer than 24 to 48 hours. °· You have a fever. °MAKE SURE YOU:  °· Understand these instructions. °· Will watch your condition. °· Will get help right away if you are not doing well or get worse. °  °This information is not intended to replace advice given to you by your health care provider. Make sure   you discuss any questions you have with your health care provider.   Document Released: 11/07/2005 Document Revised: 01/30/2012 Document Reviewed: 04/06/2011 Elsevier Interactive Patient Education 2016 [employer] for Gastroesophageal  Reflux Disease, Adult When you have gastroesophageal reflux disease (GERD), the foods you eat and your eating habits are very important. Choosing the right foods can help ease the discomfort of GERD. WHAT GENERAL GUIDELINES DO I NEED TO FOLLOW?  Choose fruits, vegetables, whole grains, low-fat dairy products, and low-fat meat, fish, and poultry.  Limit fats such as oils, salad dressings, butter, nuts, and avocado.  Keep a food diary to identify foods that cause symptoms.  Avoid foods that cause reflux. These may be different for different people.  Eat frequent small meals instead of three large meals each day.  Eat your meals slowly, in a relaxed setting.  Limit fried foods.  Cook foods using methods other than frying.  Avoid drinking alcohol.  Avoid drinking large amounts of liquids with your meals.  Avoid bending over or lying down until 2-3 hours after eating. WHAT FOODS ARE NOT RECOMMENDED? The following are some foods and drinks that may worsen your symptoms: Vegetables Tomatoes. Tomato juice. Tomato and spaghetti sauce. Chili peppers. Onion and garlic. Horseradish. Fruits Oranges, grapefruit, and lemon (fruit and juice). Meats High-fat meats, fish, and poultry. This includes hot dogs, ribs, ham, sausage, salami, and bacon. Dairy Whole milk and chocolate milk. Sour cream. Cream. Butter. Ice cream. Cream cheese.  Beverages Coffee and tea, with or without caffeine. Carbonated beverages or energy drinks. Condiments Hot sauce. Barbecue sauce.  Sweets/Desserts Chocolate and cocoa. Donuts. Peppermint and spearmint. Fats and Oils High-fat foods, including [place] fries and potato chips. Other Vinegar. Strong spices, such as black pepper, white pepper, red pepper, cayenne, curry powder, cloves, ginger, and chili powder. The items listed above may not be a complete list of foods and beverages to avoid. Contact your dietitian for more information.   This information is not  intended to replace advice given to you by your health care provider. Make sure you discuss any questions you have with your health care provider.   Document Released: 11/07/2005 Document Revised: 11/28/2014 Document Reviewed: 09/11/2013 Elsevier Interactive Patient Education [employer].

## 2016-05-09 DIAGNOSIS — R0789 Other chest pain: Secondary | ICD-10-CM | POA: Diagnosis not present

## 2016-05-09 DIAGNOSIS — I2510 Atherosclerotic heart disease of native coronary artery without angina pectoris: Secondary | ICD-10-CM | POA: Diagnosis not present

## 2016-05-17 ENCOUNTER — Other Ambulatory Visit: Payer: Self-pay | Admitting: Gastroenterology

## 2016-05-17 DIAGNOSIS — D123 Benign neoplasm of transverse colon: Secondary | ICD-10-CM | POA: Diagnosis not present

## 2016-05-17 DIAGNOSIS — R1030 Lower abdominal pain, unspecified: Secondary | ICD-10-CM | POA: Diagnosis not present

## 2016-05-17 DIAGNOSIS — K3189 Other diseases of stomach and duodenum: Secondary | ICD-10-CM | POA: Diagnosis not present

## 2016-05-17 DIAGNOSIS — K644 Residual hemorrhoidal skin tags: Secondary | ICD-10-CM | POA: Diagnosis not present

## 2016-05-17 DIAGNOSIS — D126 Benign neoplasm of colon, unspecified: Secondary | ICD-10-CM | POA: Diagnosis not present

## 2016-05-17 DIAGNOSIS — R1013 Epigastric pain: Secondary | ICD-10-CM | POA: Diagnosis not present

## 2016-05-17 DIAGNOSIS — R634 Abnormal weight loss: Secondary | ICD-10-CM | POA: Diagnosis not present

## 2016-05-17 DIAGNOSIS — R112 Nausea with vomiting, unspecified: Secondary | ICD-10-CM | POA: Diagnosis not present

## 2016-05-17 DIAGNOSIS — D124 Benign neoplasm of descending colon: Secondary | ICD-10-CM | POA: Diagnosis not present

## 2016-05-20 DIAGNOSIS — R112 Nausea with vomiting, unspecified: Secondary | ICD-10-CM | POA: Diagnosis not present

## 2016-05-20 DIAGNOSIS — K8020 Calculus of gallbladder without cholecystitis without obstruction: Secondary | ICD-10-CM | POA: Diagnosis not present

## 2016-05-20 DIAGNOSIS — R1084 Generalized abdominal pain: Secondary | ICD-10-CM | POA: Diagnosis not present

## 2016-05-25 DIAGNOSIS — H2512 Age-related nuclear cataract, left eye: Secondary | ICD-10-CM | POA: Diagnosis not present

## 2016-05-25 DIAGNOSIS — H2511 Age-related nuclear cataract, right eye: Secondary | ICD-10-CM | POA: Diagnosis not present

## 2016-05-30 DIAGNOSIS — D649 Anemia, unspecified: Secondary | ICD-10-CM | POA: Diagnosis not present

## 2016-06-01 DIAGNOSIS — H2511 Age-related nuclear cataract, right eye: Secondary | ICD-10-CM | POA: Diagnosis not present

## 2016-06-10 DIAGNOSIS — K8010 Calculus of gallbladder with chronic cholecystitis without obstruction: Secondary | ICD-10-CM | POA: Diagnosis not present

## 2016-06-11 ENCOUNTER — Ambulatory Visit: Payer: Self-pay | Admitting: Surgery

## 2016-06-11 NOTE — H&P (Signed)
History of Present Illness Nicole Lloyd. Nicole Gardiner MD; 06/10/2016 6:33 PM) Patient words: GB.  The patient is a 70 year old female who presents for evaluation of gall stones. Referred by Dr. Antony Blackbird for symptomatic gallstones GI - Nicole Lloyd  This is a 69 year old female who presents with about four months of worsening nausea and vomiting. These symptoms are intermittent and are usually accompanied by upper abdominal pain. She describes pain that wraps around her upper abdomen around to her back. She reports diarrhea and abdominal bloating. These episodes are exacerbated by eating. She was referred to GI for further evaluation. EGD by Dr. Paulita Lloyd on 6/27 was unremarkable except for mild duodenitis. Colonoscopy showed two small polyps in the descending and transverse colon which were resected. Pathology was tubular adenoma. Her symptoms continue to worsen and she is afraid to eat anything. She underwent CT and ultrasound.  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 Her neurologist is Dr. Rogers Blocker at [facility] US/ CT scan   Other Problems (Nicole Eversole, LPN; QA348G 075-GRM AM) Cerebrovascular Accident Cholelithiasis Diabetes Mellitus Gastroesophageal Reflux Disease Myocardial infarction Oophorectomy  Past Surgical History (Nicole Eversole, LPN; QA348G 075-GRM AM) Coronary Artery Bypass Graft Hysterectomy (not due to cancer) - Complete Ventral / Umbilical Hernia Surgery Bilateral.  Diagnostic Studies History (Nicole Eversole, LPN; QA348G 075-GRM AM) Colonoscopy never Mammogram >3 years ago Pap Smear >5 years ago  Allergies (Nicole Eversole, LPN; QA348G 579FGE AM) No Known Drug Allergies07/21/2017  Medication History (Nicole Eversole, LPN; QA348G 579FGE AM) Aspirin-Dipyridamole ER (25-200MG  Capsule ER 12HR, Oral) Active. MetFORMIN HCl (1000MG  Tablet, Oral) Active. Metoprolol Tartrate (50MG  Tablet, Oral) Active. Pantoprazole Sodium  (40MG  Tablet DR, Oral) Active. Rosuvastatin Calcium (40MG  Tablet, Oral) Active. RaNITidine HCl (150MG  Capsule, Oral) Active. Medications Reconciled  Social History (Nicole Eversole, LPN; QA348G 075-GRM AM) Alcohol use Occasional alcohol use. Caffeine use Carbonated beverages, Coffee, Tea. No drug use Tobacco use Never smoker.  Family History Nicole Borer, LPN; QA348G 075-GRM AM) Alcohol Abuse Brother. Arthritis Mother. Breast Cancer Mother. Cerebrovascular Accident Mother. Cervical Cancer Daughter. Depression Mother. Diabetes Mellitus Mother. Heart disease in female family member before age 45 Hypertension Mother. Respiratory Condition Brother, Mother. Seizure disorder Mother.  Pregnancy / Birth History Nicole Borer, LPN; QA348G 075-GRM AM) Age at menarche 63 years. Age of menopause >58 Contraceptive History Intrauterine device, Oral contraceptives. Gravida 4 Maternal age 50-20 Para 4 Regular periods    Review of Systems (Nicole Eversole LPN; QA348G 075-GRM AM) General Present- Appetite Loss, Fatigue, Night Sweats and Weight Loss. Not Present- Chills, Fever and Weight Gain. Skin Present- Dryness. Not Present- Change in Wart/Mole, Hives, Jaundice, New Lesions, Non-Healing Wounds, Rash and Ulcer. HEENT Present- Wears glasses/contact lenses. Not Present- Earache, Hearing Loss, Hoarseness, Nose Bleed, Oral Ulcers, Ringing in the Ears, Seasonal Allergies, Sinus Pain, Sore Throat, Visual Disturbances and Yellow Eyes. Breast Not Present- Breast Mass, Breast Pain, Nipple Discharge and Skin Changes. Cardiovascular Present- Leg Cramps. Not Present- Chest Pain, Difficulty Breathing Lying Down, Palpitations, Rapid Heart Rate, Shortness of Breath and Swelling of Extremities. Gastrointestinal Present- Abdominal Pain, Bloating, Constipation, Difficulty Swallowing, Indigestion, Nausea and Vomiting. Not Present- Bloody Stool, Change in Bowel Habits, Chronic  diarrhea, Excessive gas, Gets full quickly at meals, Hemorrhoids and Rectal Pain. Female Genitourinary Present- Pelvic Pain. Not Present- Frequency, Nocturia, Painful Urination and Urgency. Musculoskeletal Present- Muscle Weakness. Not Present- Back Pain, Joint Pain, Joint Stiffness, Muscle Pain and Swelling of Extremities. Neurological Present- Numbness, Trouble walking and  Weakness. Not Present- Decreased Memory, Fainting, Headaches, Seizures, Tingling and Tremor. Psychiatric Not Present- Anxiety, Bipolar, Change in Sleep Pattern, Depression, Fearful and Frequent crying. Endocrine Not Present- Cold Intolerance, Excessive Hunger, Hair Changes, Heat Intolerance, Hot flashes and New Diabetes. Hematology Present- Blood Thinners. Not Present- Easy Bruising, Excessive bleeding, Gland problems, HIV and Persistent Infections.  Vitals (Nicole Eversole LPN; QA348G 075-GRM AM) 06/10/2016 9:39 AM Weight: 131 lb Height: 65in Body Surface Area: 1.65 m Body Mass Index: 21.8 kg/m  Temp.: 97.59F(Oral)  Pulse: 62 (Regular)  BP: 126/60 (Sitting, Left Arm, Standard)       Physical Exam Rodman Key K. Alicianna Litchford MD; 06/10/2016 6:34 PM) The physical exam findings are as follows: Note:WDWN in NAD HEENT: EOMI, sclera anicteric Neck: No masses, no thyromegaly Lungs: CTA bilaterally; normal respiratory effort CV: Regular rate and rhythm; no murmurs Abd: +bowel sounds, soft,moderately tender in RUQ, epigastrium, extending across to LUQ; no palpable masses Ext: Well-perfused; no edema Skin: Warm, dry; no sign of jaundice    Assessment & Plan Rodman Key K. Amarea Macdowell MD; 06/10/2016 6:35 PM) CHRONIC CHOLECYSTITIS WITH CALCULUS (K80.10) Current Plans Schedule for Surgery - Laparoscopic cholecystectomy with intraoperative cholangiogram. The surgical procedure has been discussed with the patient. Potential risks, benefits, alternative treatments, and expected outcomes have been explained. All of the patient's  questions at this time have been answered. The likelihood of reaching the patient's treatment goal is good. The patient understand the proposed surgical procedure and wishes to proceed. Pt Education - Pamphlet Given - Laparoscopic Gallbladder Surgery: discussed with patient and provided information. Note:She understands that her gallbladder may not be the source of her LUQ pain, but she clearly has symptoms related to her gallbladder. We will proceed with surgery and will see what symptoms remain after she has recovered from surgery.  Nicole Lloyd. Georgette Dover, MD, [facility] Surgery  General/ Trauma Surgery  06/11/2016 9:12 AM

## 2016-06-14 ENCOUNTER — Telehealth: Payer: Self-pay | Admitting: Cardiovascular Disease

## 2016-06-14 NOTE — Telephone Encounter (Signed)
Received records from [facility] for appointment with Dr Gwenlyn Found on 07/13/16.  Records given to [facility] (medical records) for Dr Kennon Holter schedule on 07/13/16. lp

## 2016-06-14 NOTE — Telephone Encounter (Signed)
This does not appear to be my patient. Will need to see cardiologist for cardiovascular clearance.

## 2016-06-14 NOTE — Telephone Encounter (Signed)
Surgical clearance routed to MD Gwenlyn Found and primary RN

## 2016-06-14 NOTE — Telephone Encounter (Signed)
Pt already has appt schedule with Dr. Gwenlyn Found 07/13/16 @ 1045 as new pt for surgical clearance.

## 2016-06-16 DIAGNOSIS — I2510 Atherosclerotic heart disease of native coronary artery without angina pectoris: Secondary | ICD-10-CM | POA: Diagnosis not present

## 2016-06-16 DIAGNOSIS — I10 Essential (primary) hypertension: Secondary | ICD-10-CM | POA: Diagnosis not present

## 2016-06-16 DIAGNOSIS — E785 Hyperlipidemia, unspecified: Secondary | ICD-10-CM | POA: Diagnosis not present

## 2016-06-16 DIAGNOSIS — I63213 Cerebral infarction due to unspecified occlusion or stenosis of bilateral vertebral arteries: Secondary | ICD-10-CM | POA: Diagnosis not present

## 2016-06-16 DIAGNOSIS — E119 Type 2 diabetes mellitus without complications: Secondary | ICD-10-CM | POA: Diagnosis not present

## 2016-07-05 DIAGNOSIS — D649 Anemia, unspecified: Secondary | ICD-10-CM | POA: Diagnosis not present

## 2016-07-13 ENCOUNTER — Ambulatory Visit: Payer: Medicare HMO | Admitting: Cardiovascular Disease

## 2016-07-14 ENCOUNTER — Encounter: Payer: Self-pay | Admitting: *Deleted

## 2016-07-14 NOTE — Progress Notes (Signed)
Letter mailed

## 2016-07-28 DIAGNOSIS — I6529 Occlusion and stenosis of unspecified carotid artery: Secondary | ICD-10-CM | POA: Diagnosis not present

## 2016-07-28 DIAGNOSIS — E785 Hyperlipidemia, unspecified: Secondary | ICD-10-CM | POA: Diagnosis not present

## 2016-07-28 DIAGNOSIS — I2510 Atherosclerotic heart disease of native coronary artery without angina pectoris: Secondary | ICD-10-CM | POA: Diagnosis not present

## 2016-07-28 DIAGNOSIS — R8299 Other abnormal findings in urine: Secondary | ICD-10-CM | POA: Diagnosis not present

## 2016-07-28 DIAGNOSIS — I10 Essential (primary) hypertension: Secondary | ICD-10-CM | POA: Diagnosis not present

## 2016-07-28 DIAGNOSIS — E119 Type 2 diabetes mellitus without complications: Secondary | ICD-10-CM | POA: Diagnosis not present

## 2016-07-28 DIAGNOSIS — K219 Gastro-esophageal reflux disease without esophagitis: Secondary | ICD-10-CM | POA: Diagnosis not present

## 2016-07-28 DIAGNOSIS — D649 Anemia, unspecified: Secondary | ICD-10-CM | POA: Diagnosis not present

## 2016-07-28 DIAGNOSIS — K8020 Calculus of gallbladder without cholecystitis without obstruction: Secondary | ICD-10-CM | POA: Diagnosis not present

## 2016-08-01 DIAGNOSIS — Z111 Encounter for screening for respiratory tuberculosis: Secondary | ICD-10-CM | POA: Diagnosis not present

## 2016-08-10 DIAGNOSIS — R69 Illness, unspecified: Secondary | ICD-10-CM | POA: Diagnosis not present

## 2016-08-30 NOTE — Pre-Procedure Instructions (Addendum)
Nicole Lloyd  08/30/2016      [id number] - [address], [place] [address] Phone: [phone] Fax: [phone]    Your procedure is scheduled on Tuesday, October 17.  Report to [facility] Admitting at 5:30 AM                Your surgery or procedure is scheduled for 7:30 AM   Call this number if you have problems the morning of surgery:[phone]                For any other questions, please call [phone], Monday - Friday 8 AM - 4 PM.   Remember:  Do not eat food or drink liquids after midnight Monday, October 16  Take these medicines the morning of surgery with A SIP OF WATER : metoprolol (LOPRESSOR), pantoprazole (PROTONIX).                   DO NOT take metFORMIN (GLUCOPHAGE) the morning of surgery.                 1 Week prior to surgery STOP taking Aspirin , Aspirin Products (Goody Powder, Excedrin Migraine), Ibuprofen (Advil), Naproxen (Aleve), Vitamins and Herbal Products (ie Fish Oil).                   Stop Oct. 12, dipyridamole-aspirin [facility]) as Instructed by Dr Tsuei.(last dose today Oct. 11)                 How to Manage Your Diabetes Before and After Surgery  Why is it important to control my blood sugar before and after surgery? . Improving blood sugar levels before and after surgery helps healing and can limit problems. . A way of improving blood sugar control is eating a healthy diet by: o  Eating less sugar and carbohydrates o  Increasing activity/exercise o  Talking with your doctor about reaching your blood sugar goals . High blood sugars (greater than 180 mg/dL) can raise your risk of infections and slow your recovery, so you will need to focus on controlling your diabetes during the weeks before surgery. . Make sure that the doctor who takes care of your diabetes knows about your planned surgery including the date and  location.  How do I manage my blood sugar before surgery? . Check your blood sugar at least 4 times a day, starting 2 days before surgery, to make sure that the level is not too high or low. o Check your blood sugar the morning of your surgery when you wake up and every 2 hours until you get to the Short Stay unit. . If your blood sugar is less than 70 mg/dL, you will need to treat for low blood sugar: o Do not take insulin. o Treat a low blood sugar (less than 70 mg/dL) with  cup of clear juice (cranberry or apple), 4 glucose tablets, OR glucose gel. o Recheck blood sugar in 15 minutes after treatment (to make sure it is greater than 70 mg/dL). If your blood sugar is not greater than 70 mg/dL on recheck, call [phone] for further instructions. . Report your blood sugar to the short stay nurse when you get to Short Stay.  . If you are admitted to the hospital after surgery: o Your blood sugar will be checked by the staff and  you will probably be given insulin after surgery (instead of oral diabetes medicines) to make sure you have good blood sugar levels. o The goal for blood sugar control after surgery is 80-180 mg/dL. WHAT DO I DO ABOUT MY DIABETES MEDICATION?  Marland Kitchen Do not take oral diabetes medicines (pills) the morning of surgery  Patient Signature:  Date:   Nurse Signature:  Date:   Do not wear jewelry, make-up or nail polish.  Do not wear lotions, powders, or perfumes, or deodorant.  Do not shave 48 hours prior to surgery.   Do not bring valuables to the hospital.  [facility] is not responsible for any belongings or valuables.  Contacts, dentures or bridgework may not be worn into surgery.  Leave your suitcase in the car.  After surgery it may be brought to your room.  For patients admitted to the hospital, discharge time will be determined by your treatment team.  Patients discharged the day of surgery will not be allowed to drive home.     Special instructions: Review   [facility] - Preparing For Surgery.  Please read over the following fact sheets that you were given. Review  [facility] - Preparing For Surgery, Pain Management, Coughing and Deep Breathing

## 2016-08-31 ENCOUNTER — Encounter (HOSPITAL_COMMUNITY)
Admission: RE | Admit: 2016-08-31 | Discharge: 2016-08-31 | Disposition: A | Discharge status: Home / Self Care | Payer: Medicare HMO | Source: Ambulatory Visit | Attending: Surgery | Admitting: Surgery

## 2016-08-31 ENCOUNTER — Encounter (HOSPITAL_COMMUNITY): Payer: Self-pay

## 2016-08-31 DIAGNOSIS — Z01812 Encounter for preprocedural laboratory examination: Secondary | ICD-10-CM | POA: Insufficient documentation

## 2016-08-31 DIAGNOSIS — K8010 Calculus of gallbladder with chronic cholecystitis without obstruction: Secondary | ICD-10-CM | POA: Diagnosis not present

## 2016-08-31 DIAGNOSIS — Z01810 Encounter for preprocedural cardiovascular examination: Secondary | ICD-10-CM | POA: Diagnosis not present

## 2016-08-31 HISTORY — DX: Schizophrenia, unspecified: F20.9

## 2016-08-31 HISTORY — DX: Unspecified osteoarthritis, unspecified site: M19.90

## 2016-08-31 LAB — CBC
HEMATOCRIT: 34 % — AB (ref 36.0–46.0)
HEMOGLOBIN: 11 g/dL — AB (ref 12.0–15.0)
MCH: 30.1 pg (ref 26.0–34.0)
MCHC: 32.4 g/dL (ref 30.0–36.0)
MCV: 93.2 fL (ref 78.0–100.0)
Platelets: 139 10*3/uL — ABNORMAL LOW (ref 150–400)
RBC: 3.65 MIL/uL — ABNORMAL LOW (ref 3.87–5.11)
RDW: 12.8 % (ref 11.5–15.5)
WBC: 6.6 10*3/uL (ref 4.0–10.5)

## 2016-08-31 LAB — BASIC METABOLIC PANEL
ANION GAP: 9 (ref 5–15)
BUN: 11 mg/dL (ref 6–20)
CO2: 25 mmol/L (ref 22–32)
Calcium: 9.3 mg/dL (ref 8.9–10.3)
Chloride: 105 mmol/L (ref 101–111)
Creatinine, Ser: 1.01 mg/dL — ABNORMAL HIGH (ref 0.44–1.00)
GFR calc Af Amer: >60 mL/min (ref >60)
GFR calc non Af Amer: 54 mL/min — ABNORMAL LOW (ref >60)
GLUCOSE: 111 mg/dL — AB (ref 65–99)
POTASSIUM: 4 mmol/L (ref 3.5–5.1)
Sodium: 139 mmol/L (ref 135–145)

## 2016-08-31 LAB — GLUCOSE, CAPILLARY
GLUCOSE-CAPILLARY: 73 mg/dL (ref 65–99)
Glucose-Capillary: 79 mg/dL (ref 65–99)

## 2016-08-31 NOTE — Progress Notes (Signed)
PCP: Dr. Antony Blackbird @ [facility]- request hgb A1C and notes, fasting sugars 75-116  Cardiologist: Dr. Vear Clock @ [facility]- request studies/ekg/notes  Neurologist: Dr. Gale Journey @ [facility] Dr. Georgette Dover office for instructions when to stop aspirin/aggrenox. Spoke to Nurse Sunday Spillers stated today was the last day to take these meds. Instructions given to Pt.

## 2016-09-01 NOTE — Progress Notes (Addendum)
Anesthesia chart review: Patient is a 70 year old female scheduled for cholecystectomy on 09/06/2016 by Dr. Georgette Dover.  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  PCP is Dr. Antony Blackbird with [facility].  Cardiologist is Dr. Vear Clock with [facility], records pending.  Neurologist is Dr. Laurene Footman with [facility] (Care Everywhere). At her 06/16/16 office visit, Dr. Gale Journey wrote, " From our neurological standpoint, she should be able to stop her ASA/ Aggrenox prior to the surgery. The duration of how long she needs to be off of the antiplatelet therapy is based on the surgeon's preference. Post-operatively, she should be placed back on her antiplatelet therapy as soon as it is appropriate from a surgical standpoint."   Neurosur[facility]. Carl Best [facility]; Care Everywhere). On 06/16/16 Dr. 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 Surgery is reasonable, but care should be taken to prevent any hypotension. She may come off of aggreneox for the procedure from my standpoint, but if she could continue a baby aspirin, that would increase safety."  Meds include Fosamax, Xanax, ASA 81 mg, Aggrenox, metformin, Lopressor, Protonix, sucralfate, trazodone. Last ASA/Aggrenox dose 09/01/16.  BP 126/67   Pulse 67   Temp 36.6 C   Resp 20   Ht 5\' 5"  (1.651 m)   Wt 139 lb 9.6 oz (63.3 kg)   SpO2 100%   BMI 23.23 kg/m    08/31/16 EKG: SB at 58 bpm, non-specific T wave abnormality.  09/12/14 Nuclear stress test [facility]; Care Everywhere): IMPRESSION: Normal  perfusion study. EF 55%. No transient ischemic dilatation or diagnostic changes.  07/03/15 Echo [facility]; Care Everywhere): Summary The left ventricular size is normal. There is mild concentric left ventricular hypertrophy. Left ventricular systolic function is normal. EF 65-70%. Left ventricular filling pattern is pseudonormal. The right ventricle is normal in size and function. The left atrial volume is mildly increased. There is mild mitral regurgitation. There is mild tricuspid regurgitation. Borderline pulmonary hypertension. RVSP 31.1 mmHg. Brisk, prominent caval inflow in seen. There is no pericardial effusion. Injection of agitated saline showed no right-to-left shunt.  09/12/14 Nuclear stress test [facility]; Care Everywhere): IMPRESSION: Normal perfusion study. EF 55%. No transient ischemic dilatation or diagnostic changes.  11/02/10 Cardiac cath: Final conclusion: 1. Patent LIMA to LAD.  2. Patent left radial to RCA. 3. A 50% lesion in the mid saphenous vein graft to diagonal. 4. Well collateralized circumflex/obtuse marginal system, 100% occluded proximal LAD, 100% occluded RCA, 100% occluded proximal circumflex. 5. Normal left ventricular function. Plan: The patient will be discharged home in the morning on Imdur 30 mg daily and Plavix 75 mg daily in addition to her current medical regimen.  02/08/16 CTA neck [facility]; Care Everywhere): 1.The burden of calcified atherosclerotic plaque limits evaluation of the origins and proximal vertebral arteries bilaterally although there appears to be a similar, approximately 50% stenosis, of the origin of the right vertebral artery as seen on prior diagnostic cerebral angiography. The vertebral arteries are opacified along their courses. 2.Scattered calcified atherosclerotic plaque of the carotid arteries including the intracranial segments without evidence of high-grade stenosis.  07/06/15 Neuro transcranial U/S embolic  monitoring W/o contras (  [facility]; Care Everywhere: Physician Impressions Insonation at multiple depths was attempted of the [facility]. The Intracranial flow velocities demonstrated are considered  to be within normal limits. All vessel segments insonated had normal  velocities and waveforms. The basilar artery was monitored for 30 minutes and no embolic signals were  detected. Sonographer Comments Embolic Monitoring of the BASILAR artery was performed for a minimum of 30  minutes that was NEGATIVE   Preoperative labs noted. Cr 1.01, glucose 111. H/H 11/34, PLT 139.  Will follow-up once cardiology records received.  George Hugh [facility] Short Stay Center/Anesthesiology Phone [phone] 09/01/2016 11:34 AM  Addendum: Records received from Dr. Rivka Spring, last visit 05/02/16. Stress test was ordered due to atypical chest pain (see below).  05/09/16 Nuclear stress test [facility] CV): Impression:  1. Resting EKG demonstrates normal sinus rhythm, normal axis, inferior lateral T-wave inversion, cannot exclude ischemia. EKG is nondiagnostic for ischemia as it's a pharmacological stress test. Stress symptoms included dyspnea. 2. Left ventricle is very small, LV end-diastolic volume of 43 mL. Stress and rest SPECT images demonstrate homogeneous tracer distribution throughout the myocardium. Gated SPECT images reveal normal myocardial thickening and wall motion. The left ventricular EF was calculated at 34%, however visually appears to be normal. Additionally the right ventricle is normal. This is a low risk scan.   Discussed stress test findings with Dr. Conrad . Visualized EF appeared normal by cardiology and test felt low risk. If no acute changes then I would anticipate that she can proceed as planned.  George Hugh [facility] Short Stay Center/Anesthesiology Phone [phone] 09/02/2016 1:32 PM

## 2016-09-05 ENCOUNTER — Encounter (HOSPITAL_COMMUNITY): Payer: Self-pay | Admitting: Anesthesiology

## 2016-09-05 NOTE — Anesthesia Preprocedure Evaluation (Addendum)
Anesthesia Evaluation  Patient identified by MRN, date of birth, ID band Patient awake    Reviewed: Allergy & Precautions, NPO status , Patient's Chart, lab work & pertinent test results, reviewed documented beta blocker date and time   Airway Mallampati: III  TM Distance: >3 FB Neck ROM: Full    Dental  (+) Edentulous Upper, Poor Dentition, Missing   Pulmonary    Pulmonary exam normal breath sounds clear to auscultation       Cardiovascular hypertension, Pt. on medications and Pt. on home beta blockers + CAD, + Past MI and + CABG  Normal cardiovascular exam+ dysrhythmias Supra Ventricular Tachycardia  Rhythm:Regular Rate:Normal  MI- 2000 CABG 2v-2000    Neuro/Psych PSYCHIATRIC DISORDERS Schizophrenia CVA    GI/Hepatic GERD  Medicated and Controlled,Chronic cholecystitis with cholelithiasis   Endo/Other  diabetes, Well Controlled, Type 2  Renal/GU negative Renal ROS  negative genitourinary   Musculoskeletal  (+) Arthritis , Osteoarthritis,  Osteoporosis   Abdominal Normal abdominal exam  (+)   Peds  Hematology  (+) anemia , Thrombocytopenia-mild   Anesthesia Other Findings   Reproductive/Obstetrics negative OB ROS                             Chemistry      Component Value Date/Time   NA 139 08/31/2016 0930   K 4.0 08/31/2016 0930   CL 105 08/31/2016 0930   CO2 25 08/31/2016 0930   BUN 11 08/31/2016 0930   CREATININE 1.01 (H) 08/31/2016 0930      Component Value Date/Time   CALCIUM 9.3 08/31/2016 0930   ALKPHOS 37 (L) 05/07/2016 2116   AST 19 05/07/2016 2116   ALT 10 (L) 05/07/2016 2116   BILITOT 1.0 05/07/2016 2116     Lab Results  Component Value Date   WBC 6.6 08/31/2016   HGB 11.0 (L) 08/31/2016   HCT 34.0 (L) 08/31/2016   MCV 93.2 08/31/2016   PLT 139 (L) 08/31/2016    EKG: nonspecific ST and T waves changes, sinus bradycardia. Echo 11/05/2004- results  reviewed  Anesthesia Physical Anesthesia Plan  ASA: III  Anesthesia Plan: General   Post-op Pain Management:    Induction: Intravenous, Rapid sequence and Cricoid pressure planned  Airway Management Planned: Oral ETT  Additional Equipment:   Intra-op Plan:   Post-operative Plan: Extubation in OR  Informed Consent: I have reviewed the patients History and Physical, chart, labs and discussed the procedure including the risks, benefits and alternatives for the proposed anesthesia with the patient or authorized representative who has indicated his/her understanding and acceptance.   Dental advisory given  Plan Discussed with: CRNA, Surgeon and Anesthesiologist  Anesthesia Plan Comments:         Anesthesia Quick Evaluation

## 2016-09-06 ENCOUNTER — Ambulatory Visit (HOSPITAL_COMMUNITY): Payer: Medicare HMO

## 2016-09-06 ENCOUNTER — Ambulatory Visit (HOSPITAL_COMMUNITY): Payer: Medicare HMO | Admitting: Vascular Surgery

## 2016-09-06 ENCOUNTER — Ambulatory Visit (HOSPITAL_COMMUNITY): Payer: Medicare HMO | Admitting: Anesthesiology

## 2016-09-06 ENCOUNTER — Encounter (HOSPITAL_COMMUNITY): Payer: Self-pay | Admitting: Urology

## 2016-09-06 ENCOUNTER — Ambulatory Visit (HOSPITAL_COMMUNITY)
Admission: RE | Admit: 2016-09-06 | Discharge: 2016-09-07 | Disposition: A | Discharge status: Home / Self Care | Payer: Medicare HMO | Source: Ambulatory Visit | Attending: Surgery | Admitting: Surgery

## 2016-09-06 ENCOUNTER — Encounter (HOSPITAL_COMMUNITY)
Admission: RE | Disposition: A | Discharge status: Home / Self Care | Payer: Self-pay | Source: Ambulatory Visit | Attending: Surgery

## 2016-09-06 DIAGNOSIS — M810 Age-related osteoporosis without current pathological fracture: Secondary | ICD-10-CM | POA: Insufficient documentation

## 2016-09-06 DIAGNOSIS — D696 Thrombocytopenia, unspecified: Secondary | ICD-10-CM | POA: Insufficient documentation

## 2016-09-06 DIAGNOSIS — I2510 Atherosclerotic heart disease of native coronary artery without angina pectoris: Secondary | ICD-10-CM | POA: Diagnosis not present

## 2016-09-06 DIAGNOSIS — K8010 Calculus of gallbladder with chronic cholecystitis without obstruction: Secondary | ICD-10-CM | POA: Diagnosis not present

## 2016-09-06 DIAGNOSIS — Z90710 Acquired absence of both cervix and uterus: Secondary | ICD-10-CM | POA: Insufficient documentation

## 2016-09-06 DIAGNOSIS — I252 Old myocardial infarction: Secondary | ICD-10-CM | POA: Diagnosis not present

## 2016-09-06 DIAGNOSIS — R69 Illness, unspecified: Secondary | ICD-10-CM | POA: Diagnosis not present

## 2016-09-06 DIAGNOSIS — D649 Anemia, unspecified: Secondary | ICD-10-CM | POA: Insufficient documentation

## 2016-09-06 DIAGNOSIS — F209 Schizophrenia, unspecified: Secondary | ICD-10-CM | POA: Insufficient documentation

## 2016-09-06 DIAGNOSIS — Z951 Presence of aortocoronary bypass graft: Secondary | ICD-10-CM | POA: Diagnosis not present

## 2016-09-06 DIAGNOSIS — K219 Gastro-esophageal reflux disease without esophagitis: Secondary | ICD-10-CM | POA: Diagnosis not present

## 2016-09-06 DIAGNOSIS — Z419 Encounter for procedure for purposes other than remedying health state, unspecified: Secondary | ICD-10-CM

## 2016-09-06 DIAGNOSIS — Z7982 Long term (current) use of aspirin: Secondary | ICD-10-CM | POA: Insufficient documentation

## 2016-09-06 DIAGNOSIS — E119 Type 2 diabetes mellitus without complications: Secondary | ICD-10-CM | POA: Insufficient documentation

## 2016-09-06 DIAGNOSIS — Z7901 Long term (current) use of anticoagulants: Secondary | ICD-10-CM | POA: Diagnosis not present

## 2016-09-06 DIAGNOSIS — Z7984 Long term (current) use of oral hypoglycemic drugs: Secondary | ICD-10-CM | POA: Diagnosis not present

## 2016-09-06 DIAGNOSIS — M1990 Unspecified osteoarthritis, unspecified site: Secondary | ICD-10-CM | POA: Diagnosis not present

## 2016-09-06 DIAGNOSIS — I10 Essential (primary) hypertension: Secondary | ICD-10-CM | POA: Diagnosis not present

## 2016-09-06 DIAGNOSIS — Z8673 Personal history of transient ischemic attack (TIA), and cerebral infarction without residual deficits: Secondary | ICD-10-CM | POA: Diagnosis not present

## 2016-09-06 DIAGNOSIS — K8012 Calculus of gallbladder with acute and chronic cholecystitis without obstruction: Secondary | ICD-10-CM | POA: Diagnosis not present

## 2016-09-06 DIAGNOSIS — I471 Supraventricular tachycardia: Secondary | ICD-10-CM | POA: Diagnosis not present

## 2016-09-06 HISTORY — PX: CHOLECYSTECTOMY: SHX55

## 2016-09-06 LAB — GLUCOSE, CAPILLARY
GLUCOSE-CAPILLARY: 127 mg/dL — AB (ref 65–99)
GLUCOSE-CAPILLARY: 154 mg/dL — AB (ref 65–99)
Glucose-Capillary: 82 mg/dL (ref 65–99)
Glucose-Capillary: 201 mg/dL — ABNORMAL HIGH (ref 65–99)

## 2016-09-06 SURGERY — LAPAROSCOPIC CHOLECYSTECTOMY WITH INTRAOPERATIVE CHOLANGIOGRAM
Anesthesia: General | Site: Abdomen

## 2016-09-06 MED ORDER — METOCLOPRAMIDE HCL 5 MG/ML IJ SOLN
10.0000 mg | Freq: Once | INTRAMUSCULAR | Status: AC | PRN
  Administered 2016-09-06: 10 mg via INTRAVENOUS

## 2016-09-06 MED ORDER — IBUPROFEN 600 MG PO TABS: 600.0000 mg | ORAL_TABLET | Freq: Four times a day (QID) | ORAL | Status: DC | PRN

## 2016-09-06 MED ORDER — FENTANYL CITRATE (PF) 100 MCG/2ML IJ SOLN
INTRAMUSCULAR | Status: DC | PRN
  Administered 2016-09-06 (×3): 50 ug via INTRAVENOUS

## 2016-09-06 MED ORDER — HYDROCODONE-ACETAMINOPHEN 5-325 MG PO TABS
1.0000 | ORAL_TABLET | ORAL | Status: DC | PRN
  Administered 2016-09-06 (×2): 1 via ORAL
  Administered 2016-09-07: 2 via ORAL
  Filled 2016-09-06 (×4): qty 1

## 2016-09-06 MED ORDER — PANTOPRAZOLE SODIUM 40 MG PO TBEC
40.0000 mg | DELAYED_RELEASE_TABLET | Freq: Every day | ORAL | Status: DC
  Administered 2016-09-07: 40 mg via ORAL
  Filled 2016-09-06: qty 1

## 2016-09-06 MED ORDER — METFORMIN HCL 500 MG PO TABS
1000.0000 mg | ORAL_TABLET | Freq: Two times a day (BID) | ORAL | Status: DC
Start: 2016-09-06 — End: 2016-09-07
  Administered 2016-09-06 – 2016-09-07 (×2): 1000 mg via ORAL
  Filled 2016-09-06 (×2): qty 2

## 2016-09-06 MED ORDER — ALPRAZOLAM 0.25 MG PO TABS
0.2500 mg | ORAL_TABLET | Freq: Every day | ORAL | Status: DC
  Administered 2016-09-06: 0.25 mg via ORAL
  Filled 2016-09-06: qty 1

## 2016-09-06 MED ORDER — PROPOFOL 10 MG/ML IV BOLUS
INTRAVENOUS | Status: AC
  Filled 2016-09-06: qty 20

## 2016-09-06 MED ORDER — ONDANSETRON HCL 4 MG/2ML IJ SOLN
INTRAMUSCULAR | Status: DC | PRN
  Administered 2016-09-06: 4 mg via INTRAVENOUS

## 2016-09-06 MED ORDER — CHLORHEXIDINE GLUCONATE CLOTH 2 % EX PADS: 6.0000 | MEDICATED_PAD | Freq: Once | CUTANEOUS | Status: DC

## 2016-09-06 MED ORDER — ALENDRONATE SODIUM 70 MG PO TABS: 70.0000 mg | ORAL_TABLET | ORAL | Status: DC

## 2016-09-06 MED ORDER — BUPIVACAINE-EPINEPHRINE 0.25% -1:200000 IJ SOLN
INTRAMUSCULAR | Status: DC | PRN
  Administered 2016-09-06: 30 mL

## 2016-09-06 MED ORDER — GLYCOPYRROLATE 0.2 MG/ML IV SOSY
PREFILLED_SYRINGE | INTRAVENOUS | Status: AC
  Filled 2016-09-06: qty 3

## 2016-09-06 MED ORDER — HYDROMORPHONE HCL 1 MG/ML IJ SOLN: 0.2500 mg | INTRAMUSCULAR | Status: DC | PRN

## 2016-09-06 MED ORDER — SODIUM CHLORIDE 0.9 % IV SOLN
INTRAVENOUS | Status: DC
  Administered 2016-09-06: 17:00:00 via INTRAVENOUS

## 2016-09-06 MED ORDER — LACTATED RINGERS IV SOLN
INTRAVENOUS | Status: DC | PRN
  Administered 2016-09-06: 07:00:00 via INTRAVENOUS

## 2016-09-06 MED ORDER — ONDANSETRON 4 MG PO TBDP: 4.0000 mg | ORAL_TABLET | Freq: Four times a day (QID) | ORAL | Status: DC | PRN

## 2016-09-06 MED ORDER — SODIUM CHLORIDE 0.9 % IV SOLN
INTRAVENOUS | Status: DC | PRN
  Administered 2016-09-06: 08:00:00

## 2016-09-06 MED ORDER — CEFAZOLIN SODIUM-DEXTROSE 2-4 GM/100ML-% IV SOLN
2.0000 g | INTRAVENOUS | Status: AC
  Administered 2016-09-06: 2 g via INTRAVENOUS

## 2016-09-06 MED ORDER — NEOSTIGMINE METHYLSULFATE 5 MG/5ML IV SOSY
PREFILLED_SYRINGE | INTRAVENOUS | Status: AC
  Filled 2016-09-06: qty 5

## 2016-09-06 MED ORDER — ONDANSETRON HCL 4 MG/2ML IJ SOLN: 4.0000 mg | Freq: Four times a day (QID) | INTRAMUSCULAR | Status: DC | PRN

## 2016-09-06 MED ORDER — CEFAZOLIN SODIUM-DEXTROSE 2-4 GM/100ML-% IV SOLN
2.0000 g | Freq: Three times a day (TID) | INTRAVENOUS | Status: AC
  Administered 2016-09-06: 2 g via INTRAVENOUS
  Filled 2016-09-06: qty 100

## 2016-09-06 MED ORDER — METOCLOPRAMIDE HCL 5 MG/ML IJ SOLN: 10.0000 mg | Freq: Once | INTRAMUSCULAR | Status: DC | PRN

## 2016-09-06 MED ORDER — METOPROLOL TARTRATE 25 MG PO TABS
25.0000 mg | ORAL_TABLET | Freq: Two times a day (BID) | ORAL | Status: DC
  Administered 2016-09-06 – 2016-09-07 (×2): 25 mg via ORAL
  Filled 2016-09-06 (×2): qty 1

## 2016-09-06 MED ORDER — SODIUM CHLORIDE 0.9 % IR SOLN
Status: DC | PRN
  Administered 2016-09-06: 1000 mL

## 2016-09-06 MED ORDER — ROCURONIUM BROMIDE 100 MG/10ML IV SOLN
INTRAVENOUS | Status: DC | PRN
  Administered 2016-09-06: 35 mg via INTRAVENOUS

## 2016-09-06 MED ORDER — MORPHINE SULFATE (PF) 2 MG/ML IV SOLN: 2.0000 mg | INTRAVENOUS | Status: DC | PRN

## 2016-09-06 MED ORDER — INSULIN ASPART 100 UNIT/ML ~~LOC~~ SOLN
0.0000 [IU] | Freq: Three times a day (TID) | SUBCUTANEOUS | Status: DC
  Administered 2016-09-06: 3 [IU] via SUBCUTANEOUS

## 2016-09-06 MED ORDER — LIDOCAINE HCL (CARDIAC) 20 MG/ML IV SOLN
INTRAVENOUS | Status: DC | PRN
  Administered 2016-09-06: 30 mg via INTRAVENOUS

## 2016-09-06 MED ORDER — DEXAMETHASONE SODIUM PHOSPHATE 10 MG/ML IJ SOLN
INTRAMUSCULAR | Status: DC | PRN
  Administered 2016-09-06: 5 mg via INTRAVENOUS

## 2016-09-06 MED ORDER — TRAZODONE HCL 50 MG PO TABS
50.0000 mg | ORAL_TABLET | Freq: Every day | ORAL | Status: DC
  Administered 2016-09-06: 50 mg via ORAL
  Filled 2016-09-06: qty 1

## 2016-09-06 MED ORDER — HYDROMORPHONE HCL 1 MG/ML IJ SOLN
INTRAMUSCULAR | Status: AC
  Filled 2016-09-06: qty 1

## 2016-09-06 MED ORDER — 0.9 % SODIUM CHLORIDE (POUR BTL) OPTIME
TOPICAL | Status: DC | PRN
  Administered 2016-09-06: 1000 mL

## 2016-09-06 MED ORDER — HEMOSTATIC AGENTS (NO CHARGE) OPTIME
TOPICAL | Status: DC | PRN
  Administered 2016-09-06: 1 via TOPICAL

## 2016-09-06 MED ORDER — SUGAMMADEX SODIUM 200 MG/2ML IV SOLN
INTRAVENOUS | Status: DC | PRN
  Administered 2016-09-06: 150 mg via INTRAVENOUS

## 2016-09-06 MED ORDER — DIPHENHYDRAMINE HCL 12.5 MG/5ML PO ELIX: 12.5000 mg | ORAL_SOLUTION | Freq: Four times a day (QID) | ORAL | Status: DC | PRN

## 2016-09-06 MED ORDER — METOCLOPRAMIDE HCL 5 MG/ML IJ SOLN
INTRAMUSCULAR | Status: AC
  Filled 2016-09-06: qty 2

## 2016-09-06 MED ORDER — IOPAMIDOL (ISOVUE-300) INJECTION 61%
INTRAVENOUS | Status: AC
  Filled 2016-09-06: qty 50

## 2016-09-06 MED ORDER — HYDROMORPHONE HCL 1 MG/ML IJ SOLN
0.2500 mg | INTRAMUSCULAR | Status: DC | PRN
  Administered 2016-09-06 (×4): 0.25 mg via INTRAVENOUS

## 2016-09-06 MED ORDER — DIPHENHYDRAMINE HCL 50 MG/ML IJ SOLN: 12.5000 mg | Freq: Four times a day (QID) | INTRAMUSCULAR | Status: DC | PRN

## 2016-09-06 MED ORDER — BUPIVACAINE-EPINEPHRINE (PF) 0.25% -1:200000 IJ SOLN
INTRAMUSCULAR | Status: AC
  Filled 2016-09-06: qty 30

## 2016-09-06 MED ORDER — PROPOFOL 10 MG/ML IV BOLUS
INTRAVENOUS | Status: DC | PRN
  Administered 2016-09-06: 140 mg via INTRAVENOUS
  Administered 2016-09-06: 20 mg via INTRAVENOUS

## 2016-09-06 MED ORDER — FENTANYL CITRATE (PF) 100 MCG/2ML IJ SOLN
INTRAMUSCULAR | Status: AC
  Filled 2016-09-06: qty 2

## 2016-09-06 MED ORDER — INSULIN ASPART 100 UNIT/ML ~~LOC~~ SOLN: 0.0000 [IU] | Freq: Every day | SUBCUTANEOUS | Status: DC

## 2016-09-06 MED ORDER — ENOXAPARIN SODIUM 40 MG/0.4ML ~~LOC~~ SOLN: 40.0000 mg | SUBCUTANEOUS | Status: DC

## 2016-09-06 MED ORDER — CEFAZOLIN SODIUM-DEXTROSE 2-4 GM/100ML-% IV SOLN
INTRAVENOUS | Status: AC
  Filled 2016-09-06: qty 100

## 2016-09-06 SURGICAL SUPPLY — 48 items
APL SKNCLS STERI-STRIP NONHPOA (GAUZE/BANDAGES/DRESSINGS) ×1
APPLIER CLIP ROT 10 11.4 M/L (STAPLE) ×2
APR CLP MED LRG 11.4X10 (STAPLE) ×1
BAG SPEC RTRVL LRG 6X4 10 (ENDOMECHANICALS) ×1
BENZOIN TINCTURE PRP APPL 2/3 (GAUZE/BANDAGES/DRESSINGS) ×2 IMPLANT
BLADE SURG ROTATE 9660 (MISCELLANEOUS) IMPLANT
CANISTER SUCTION 2500CC (MISCELLANEOUS) ×2 IMPLANT
CHLORAPREP W/TINT 26ML (MISCELLANEOUS) ×2 IMPLANT
CLIP APPLIE ROT 10 11.4 M/L (STAPLE) ×1 IMPLANT
COVER MAYO STAND STRL (DRAPES) ×2 IMPLANT
COVER SURGICAL LIGHT HANDLE (MISCELLANEOUS) ×2 IMPLANT
DRAPE C-ARM 42X72 X-RAY (DRAPES) ×2 IMPLANT
DRSG TEGADERM 2-3/8X2-3/4 SM (GAUZE/BANDAGES/DRESSINGS) ×2 IMPLANT
DRSG TEGADERM 4X4.75 (GAUZE/BANDAGES/DRESSINGS) ×2 IMPLANT
ELECT REM PT RETURN 9FT ADLT (ELECTROSURGICAL) ×2
ELECTRODE REM PT RTRN 9FT ADLT (ELECTROSURGICAL) ×1 IMPLANT
FILTER SMOKE EVAC LAPAROSHD (FILTER) ×2 IMPLANT
GAUZE SPONGE 2X2 8PLY STRL LF (GAUZE/BANDAGES/DRESSINGS) ×1 IMPLANT
GLOVE BIO SURGEON STRL SZ 6 (GLOVE) ×1 IMPLANT
GLOVE BIO SURGEON STRL SZ7 (GLOVE) ×2 IMPLANT
GLOVE BIOGEL PI IND STRL 6.5 (GLOVE) ×2 IMPLANT
GLOVE BIOGEL PI IND STRL 7.5 (GLOVE) ×1 IMPLANT
GLOVE BIOGEL PI INDICATOR 6.5 (GLOVE) ×2
GLOVE BIOGEL PI INDICATOR 7.5 (GLOVE) ×2
GLOVE ECLIPSE 7.5 STRL STRAW (GLOVE) ×1 IMPLANT
GOWN STRL REUS W/ TWL LRG LVL3 (GOWN DISPOSABLE) ×3 IMPLANT
GOWN STRL REUS W/TWL LRG LVL3 (GOWN DISPOSABLE) ×8
HEMOSTAT SNOW SURGICEL 2X4 (HEMOSTASIS) ×2 IMPLANT
KIT BASIN OR (CUSTOM PROCEDURE TRAY) ×2 IMPLANT
KIT ROOM TURNOVER OR (KITS) ×2 IMPLANT
NS IRRIG 1000ML POUR BTL (IV SOLUTION) ×2 IMPLANT
PAD ARMBOARD 7.5X6 YLW CONV (MISCELLANEOUS) ×2 IMPLANT
POUCH SPECIMEN RETRIEVAL 10MM (ENDOMECHANICALS) ×2 IMPLANT
SCISSORS LAP 5X35 DISP (ENDOMECHANICALS) ×2 IMPLANT
SET CHOLANGIOGRAPH 5 50 .035 (SET/KITS/TRAYS/PACK) ×2 IMPLANT
SET IRRIG TUBING LAPAROSCOPIC (IRRIGATION / IRRIGATOR) ×2 IMPLANT
SLEEVE ENDOPATH XCEL 5M (ENDOMECHANICALS) ×2 IMPLANT
SPECIMEN JAR SMALL (MISCELLANEOUS) ×2 IMPLANT
SPONGE GAUZE 2X2 STER 10/PKG (GAUZE/BANDAGES/DRESSINGS) ×1
STRIP CLOSURE SKIN 1/2X4 (GAUZE/BANDAGES/DRESSINGS) ×2 IMPLANT
SUT MNCRL AB 4-0 PS2 18 (SUTURE) ×2 IMPLANT
TOWEL OR 17X24 6PK STRL BLUE (TOWEL DISPOSABLE) ×2 IMPLANT
TOWEL OR 17X26 10 PK STRL BLUE (TOWEL DISPOSABLE) ×1 IMPLANT
TRAY LAPAROSCOPIC MC (CUSTOM PROCEDURE TRAY) ×2 IMPLANT
TROCAR XCEL BLUNT TIP 100MML (ENDOMECHANICALS) ×2 IMPLANT
TROCAR XCEL NON-BLD 11X100MML (ENDOMECHANICALS) ×2 IMPLANT
TROCAR XCEL NON-BLD 5MMX100MML (ENDOMECHANICALS) ×2 IMPLANT
TUBING INSUFFLATION (TUBING) ×2 IMPLANT

## 2016-09-06 NOTE — H&P (Signed)
History of Present Illness  Patient words: Nicole Lloyd.  The patient is a 70 year old female who presents for evaluation of gall stones. Referred by Dr. Antony Blackbird for symptomatic gallstones [facility]  This is a 70 year old female who presents with about four months of worsening nausea and vomiting. These symptoms are intermittent and are usually accompanied by upper abdominal pain. She describes pain that wraps around her upper abdomen around to her back. She reports diarrhea and abdominal bloating. These episodes are exacerbated by eating. She was referred to GI for further evaluation. EGD by Dr. Paulita Fujita on 6/27 was unremarkable except for mild duodenitis. Colonoscopy showed two small polyps in the descending and transverse colon which were resected. Pathology was tubular adenoma. Her symptoms continue to worsen and she is afraid to eat anything. She underwent CT and ultrasound.  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 Her neurologist is Dr. Rogers Blocker at [facility]:  Right upper quadrant pain, nausea and vomiting. History of hypertension and diabetes.  EXAM: US ABDOMEN LIMITED - RIGHT UPPER QUADRANT  COMPARISON:  CT abdomen and pelvis 04/26/2016  FINDINGS: Gallbladder:  Cholelithiasis with several stones in the gallbladder. The stones appear to be mobile. Largest measures 1.6 cm diameter. No gallbladder wall thickening, edema, or sludge. Murphy's sign is negative.  Common bile duct:  Diameter: 4.4 mm, normal  Liver:  No focal lesion identified. Within normal limits in parenchymal echogenicity.  IMPRESSION: Cholelithiasis.  No additional changes to suggest cholecystitis.   Electronically Signed   By: Lucienne Capers M.D.   On: 05/07/2016 22:54   Other Problems  Cerebrovascular Accident Cholelithiasis Diabetes Mellitus Gastroesophageal Reflux Disease Myocardial infarction Oophorectomy  Past Surgical  History  Coronary Artery Bypass Graft Hysterectomy (not due to cancer) - Complete Ventral / Umbilical Hernia Surgery Bilateral.  Diagnostic Studies History  Colonoscopy never Mammogram >3 years ago Pap Smear >5 years ago  Allergies  No Known Drug Allergies  Medication History Aspirin-Dipyridamole ER (25-200MG  Capsule ER 12HR, Oral) Active. MetFORMIN HCl (1000MG  Tablet, Oral) Active. Metoprolol Tartrate (50MG  Tablet, Oral) Active. Pantoprazole Sodium (40MG  Tablet DR, Oral) Active. Rosuvastatin Calcium (40MG  Tablet, Oral) Active. RaNITidine HCl (150MG  Capsule, Oral) Active. Medications Reconciled  Social History  Alcohol use Occasional alcohol use. Caffeine use Carbonated beverages, Coffee, Tea. No drug use Tobacco use Never smoker.  Family History  Alcohol Abuse Brother. Arthritis Mother. Breast Cancer Mother. Cerebrovascular Accident Mother. Cervical Cancer Daughter. Depression Mother. Diabetes Mellitus Mother. Heart disease in female family member before age 48 Hypertension Mother. Respiratory Condition Brother, Mother. Seizure disorder Mother.  Pregnancy / Birth History  Age at menarche 38 years. Age of menopause >58 Contraceptive History Intrauterine device, Oral contraceptives. Gravida 4 Maternal age 60-20 Para 4 Regular periods    Review of Systems  General Present- Appetite Loss, Fatigue, Night Sweats and Weight Loss. Not Present- Chills, Fever and Weight Gain. Skin Present- Dryness. Not Present- Change in Wart/Mole, Hives, Jaundice, New Lesions, Non-Healing Wounds, Rash and Ulcer. HEENT Present- Wears glasses/contact lenses. Not Present- Earache, Hearing Loss, Hoarseness, Nose Bleed, Oral Ulcers, Ringing in the Ears, Seasonal Allergies, Sinus Pain, Sore Throat, Visual Disturbances and Yellow Eyes. Breast Not Present- Breast Mass, Breast Pain, Nipple Discharge and Skin Changes. Cardiovascular Present- Leg  Cramps. Not Present- Chest Pain, Difficulty Breathing Lying Down, Palpitations, Rapid Heart Rate, Shortness of Breath and Swelling of Extremities. Gastrointestinal Present- Abdominal Pain, Bloating, Constipation, Difficulty Swallowing, Indigestion, Nausea and Vomiting. Not  Present- Bloody Stool, Change in Bowel Habits, Chronic diarrhea, Excessive gas, Gets full quickly at meals, Hemorrhoids and Rectal Pain. Female Genitourinary Present- Pelvic Pain. Not Present- Frequency, Nocturia, Painful Urination and Urgency. Musculoskeletal Present- Muscle Weakness. Not Present- Back Pain, Joint Pain, Joint Stiffness, Muscle Pain and Swelling of Extremities. Neurological Present- Numbness, Trouble walking and Weakness. Not Present- Decreased Memory, Fainting, Headaches, Seizures, Tingling and Tremor. Psychiatric Not Present- Anxiety, Bipolar, Change in Sleep Pattern, Depression, Fearful and Frequent crying. Endocrine Not Present- Cold Intolerance, Excessive Hunger, Hair Changes, Heat Intolerance, Hot flashes and New Diabetes. Hematology Present- Blood Thinners. Not Present- Easy Bruising, Excessive bleeding, Gland problems, HIV and Persistent Infections.  Vitals  Weight: 131 lb Height: 65in Body Surface Area: 1.65 m Body Mass Index: 21.8 kg/m  Temp.: 97.50F(Oral)  Pulse: 62 (Regular)  BP: 126/60 (Sitting, Left Arm, Standard)       Physical Exam  The physical exam findings are as follows: Note:WDWN in NAD HEENT: EOMI, sclera anicteric Neck: No masses, no thyromegaly Lungs: CTA bilaterally; normal respiratory effort CV: Regular rate and rhythm; no murmurs Abd: +bowel sounds, soft,moderately tender in RUQ, epigastrium, extending across to LUQ; no palpable masses Ext: Well-perfused; no edema Skin: Warm, dry; no sign of jaundice    Assessment & Plan  CHRONIC CHOLECYSTITIS WITH CALCULUS (K80.10) Current Plans Schedule for Surgery - Laparoscopic cholecystectomy with  intraoperative cholangiogram. The surgical procedure has been discussed with the patient. Potential risks, benefits, alternative treatments, and expected outcomes have been explained. All of the patient's questions at this time have been answered. The likelihood of reaching the patient's treatment goal is good. The patient understand the proposed surgical procedure and wishes to proceed. Pt Education - Pamphlet Given - Laparoscopic Gallbladder Surgery: discussed with patient and provided information. Note:She understands that her gallbladder may not be the source of her LUQ pain, but she clearly has symptoms related to her gallbladder. We will proceed with surgery and will see what symptoms remain after she has recovered from surgery.   Nicole Lloyd. Georgette Dover, MD, [facility]  General/ Trauma Surgery  09/06/2016 7:18 AM

## 2016-09-06 NOTE — Transfer of Care (Signed)
Immediate Anesthesia Transfer of Care Note  Patient: Nicole Lloyd  Procedure(s) Performed: Procedure(s): LAPAROSCOPIC CHOLECYSTECTOMY WITH INTRAOPERATIVE CHOLANGIOGRAM (N/A)  Patient Location: PACU  Anesthesia Type:General  Level of Consciousness: awake, alert , oriented and patient cooperative  Airway & Oxygen Therapy: Patient Spontanous Breathing and Patient connected to nasal cannula oxygen  Post-op Assessment: Report given to RN, Post -op Vital signs reviewed and stable and Patient moving all extremities  Post vital signs: Reviewed and stable  Last Vitals:  Vitals:   09/06/16 0658  BP: (!) 133/93  Pulse: (!) 50  Resp: 20  Temp: 36.7 C    Last Pain:  Vitals:   09/06/16 0658  TempSrc: Oral         Complications: No apparent anesthesia complications

## 2016-09-06 NOTE — Op Note (Signed)
Laparoscopic Cholecystectomy with IOC Procedure Note  Indications: This patient presents with symptomatic gallbladder disease and will undergo laparoscopic cholecystectomy.  Pre-operative Diagnosis: Calculus of gallbladder with other cholecystitis, without mention of obstruction  Post-operative Diagnosis: Same  Surgeon: Leor Whyte K.   Assistants: Dr. Romana Juniper (surgical assistant required to help with exposure - difficult to visualize due to large liver and adhesions)  Anesthesia: General endotracheal anesthesia   ASA Class: 2  Procedure Details  The patient was seen again in the Holding Room. The risks, benefits, complications, treatment options, and expected outcomes were discussed with the patient. The possibilities of reaction to medication, pulmonary aspiration, perforation of viscus, bleeding, recurrent infection, finding a normal gallbladder, the need for additional procedures, failure to diagnose a condition, the possible need to convert to an open procedure, and creating a complication requiring transfusion or operation were discussed with the patient. The likelihood of improving the patient's symptoms with return to their baseline status is good.  The patient and/or family concurred with the proposed plan, giving informed consent. The site of surgery properly noted. The patient was taken to Operating Room, identified as Nicole Lloyd and the procedure verified as Laparoscopic Cholecystectomy with Intraoperative Cholangiogram. A Time Out was held and the above information confirmed.  Prior to the induction of general anesthesia, antibiotic prophylaxis was administered. General endotracheal anesthesia was then administered and tolerated well. After the induction, the abdomen was prepped with Chloraprep and draped in the sterile fashion. The patient was positioned in the supine position.  Local anesthetic agent was injected into the skin near the umbilicus and an incision  made. We dissected down to the abdominal fascia with blunt dissection.  The fascia was incised vertically and we entered the peritoneal cavity bluntly.  A pursestring suture of 0-Vicryl was placed around the fascial opening.  The Hasson cannula was inserted and secured with the stay suture.  Pneumoperitoneum was then created with CO2 and tolerated well without any adverse changes in the patient's vital signs. An 11-mm port was placed in the subxiphoid position.  Two 5-mm ports were placed in the right upper quadrant. All skin incisions were infiltrated with a local anesthetic agent before making the incision and placing the trocars.   We positioned the patient in reverse Trendelenburg, tilted slightly to the patient's left.  The gallbladder was identified, the fundus grasped and retracted cephalad. Adhesions were lysed bluntly and with the electrocautery where indicated, taking care not to injure any adjacent organs or viscus. The infundibulum was grasped and retracted laterally, exposing the peritoneum overlying the triangle of Calot. This was then divided and exposed in a blunt fashion. A critical view of the cystic duct and cystic artery was obtained.  The cystic duct was clearly identified and bluntly dissected circumferentially. The cystic duct was ligated with a clip distally.   An incision was made in the cystic duct and the [facility] cholangiogram catheter introduced. The catheter was secured using a clip. A cholangiogram was then obtained which showed good visualization of the distal and proximal biliary tree with no sign of filling defects or obstruction.  Contrast flowed easily into the duodenum. The catheter was then removed.   The cystic duct was then ligated with clips and divided. The cystic artery was identified, dissected free, ligated with clips and divided as well.   The gallbladder was dissected from the liver bed in retrograde fashion with the electrocautery. The gallbladder was removed and  placed in an Endocatch sac. The liver bed  was irrigated and inspected. Hemostasis was achieved with the electrocautery and Surgicel SNOW. Copious irrigation was utilized and was repeatedly aspirated until clear.  The gallbladder and Endocatch sac were then removed through the umbilical port site.  The pursestring suture was used to close the umbilical fascia.    We again inspected the right upper quadrant for hemostasis.  Pneumoperitoneum was released as we removed the trocars.  4-0 Monocryl was used to close the skin.   Benzoin, steri-strips, and clean dressings were applied. The patient was then extubated and brought to the recovery room in stable condition. Instrument, sponge, and needle counts were correct at closure and at the conclusion of the case.   Findings: Cholecystitis with Cholelithiasis  Estimated Blood Loss: Minimal         Drains: none         Specimens: Gallbladder           Complications: None; patient tolerated the procedure well.         Disposition: PACU - hemodynamically stable.         Condition: stable  Nicole Lloyd. Nicole Dover, MD, [facility]  General/ Trauma Surgery  09/06/2016 8:36 AM

## 2016-09-06 NOTE — Anesthesia Procedure Notes (Addendum)
Procedure Name: Intubation Date/Time: 09/06/2016 7:40 AM Performed by: Myna Bright Pre-anesthesia Checklist: Patient identified, Emergency Drugs available, Suction available, Patient being monitored and Timeout performed Patient Re-evaluated:Patient Re-evaluated prior to inductionOxygen Delivery Method: Circle system utilized Preoxygenation: Pre-oxygenation with 100% oxygen Intubation Type: IV induction Ventilation: Mask ventilation without difficulty Laryngoscope Size: Mac and 4 Grade View: Grade II Tube type: Oral Tube size: 7.0 mm Number of attempts: 1 Airway Equipment and Method: Stylet Placement Confirmation: ETT inserted through vocal cords under direct vision,  positive ETCO2,  CO2 detector and breath sounds checked- equal and bilateral Secured at: 21 cm Tube secured with: Tape Dental Injury: Teeth and Oropharynx as per pre-operative assessment  Comments: Intubation performed by [facility]

## 2016-09-06 NOTE — Progress Notes (Signed)
MD text paged for cpap orders as pt says she uses cpap at night

## 2016-09-06 NOTE — Progress Notes (Signed)

## 2016-09-06 NOTE — Anesthesia Postprocedure Evaluation (Signed)
Anesthesia Post Note  Patient: TAMEA MCCLEAF  Procedure(s) Performed: Procedure(s) (LRB): LAPAROSCOPIC CHOLECYSTECTOMY WITH INTRAOPERATIVE CHOLANGIOGRAM (N/A)  Patient location during evaluation: PACU Anesthesia Type: General Level of consciousness: awake and alert and oriented Pain management: pain level controlled Vital Signs Assessment: post-procedure vital signs reviewed and stable Respiratory status: spontaneous breathing, nonlabored ventilation, respiratory function stable and patient connected to nasal cannula oxygen Cardiovascular status: blood pressure returned to baseline and stable Postop Assessment: no signs of nausea or vomiting Anesthetic complications: no    Last Vitals:  Vitals:   09/06/16 0940 09/06/16 0955  BP: (!) 148/59 (!) 141/68  Pulse: 63 (!) 54  Resp: 19 14  Temp:      Last Pain:  Vitals:   09/06/16 0955  TempSrc:   PainSc: Asleep                 Carie Kapuscinski A.

## 2016-09-07 ENCOUNTER — Encounter (HOSPITAL_COMMUNITY): Payer: Self-pay | Admitting: Surgery

## 2016-09-07 DIAGNOSIS — K8010 Calculus of gallbladder with chronic cholecystitis without obstruction: Secondary | ICD-10-CM | POA: Diagnosis not present

## 2016-09-07 LAB — GLUCOSE, CAPILLARY
GLUCOSE-CAPILLARY: 101 mg/dL — AB (ref 65–99)
Glucose-Capillary: 103 mg/dL — ABNORMAL HIGH (ref 65–99)

## 2016-09-07 MED ORDER — HYDROCODONE-ACETAMINOPHEN 5-325 MG PO TABS: 1.0000 | ORAL_TABLET | ORAL | 0 refills | Status: DC | PRN

## 2016-09-07 NOTE — Discharge Instructions (Signed)
[facility] ______[facility] °LAPAROSCOPIC SURGERY: POST OP INSTRUCTIONS °Always review your discharge instruction sheet given to you by the facility where your surgery was performed. °IF YOU HAVE DISABILITY OR FAMILY LEAVE FORMS, YOU MUST BRING THEM TO THE OFFICE FOR PROCESSING.   °DO NOT GIVE THEM TO YOUR DOCTOR. ° °1. A prescription for pain medication may be given to you upon discharge.  Take your pain medication as prescribed, if needed.  If narcotic pain medicine is not needed, then you may take acetaminophen (Tylenol) or ibuprofen (Advil) as needed. °2. Take your usually prescribed medications unless otherwise directed. °3. If you need a refill on your pain medication, please contact your pharmacy.  They will contact our office to request authorization. Prescriptions will not be filled after 5pm or on week-ends. °4. You should follow a light diet the first few days after arrival home, such as soup and crackers, etc.  Be sure to include lots of fluids daily. °5. Most patients will experience some swelling and bruising in the area of the incisions.  Ice packs will help.  Swelling and bruising can take several days to resolve.  °6. It is common to experience some constipation if taking pain medication after surgery.  Increasing fluid intake and taking a stool softener (such as Colace) will usually help or prevent this problem from occurring.  A mild laxative (Milk of Magnesia or Miralax) should be taken according to package instructions if there are no bowel movements after 48 hours. °7. Unless discharge instructions indicate otherwise, you may remove your bandages 24-48 hours after surgery, and you may shower at that time.  You may have steri-strips (small skin tapes) in place directly over the incision.  These strips should be left on the skin for 7-10 days.  If your surgeon used skin glue on the incision, you may shower in 24 hours.  The glue will flake off over the next 2-3 weeks.  Any sutures or  staples will be removed at the office during your follow-up visit. °8. ACTIVITIES:  You may resume regular (light) daily activities beginning the next day--such as daily self-care, walking, climbing stairs--gradually increasing activities as tolerated.  You may have sexual intercourse when it is comfortable.  Refrain from any heavy lifting or straining until approved by your doctor. °a. You may drive when you are no longer taking prescription pain medication, you can comfortably wear a seatbelt, and you can safely maneuver your car and apply brakes. °b. RETURN TO WORK:  __________________________________________________________ °9. You should see your doctor in the office for a follow-up appointment approximately 2-3 weeks after your surgery.  Make sure that you call for this appointment within a day or two after you arrive home to insure a convenient appointment time. °10. OTHER INSTRUCTIONS: __________________________________________________________________________________________________________________________ __________________________________________________________________________________________________________________________ °WHEN TO CALL YOUR DOCTOR: °1. Fever over 101.0 °2. Inability to urinate °3. Continued bleeding from incision. °4. Increased pain, redness, or drainage from the incision. °5. Increasing abdominal pain ° °The clinic staff is available to answer your questions during regular business hours.  Please don’t hesitate to call and ask to speak to one of the nurses for clinical concerns.  If you have a medical emergency, go to the nearest emergency room or call 911.  A surgeon from [facility] is always on call at the hospital. °[address], [address], [place], [place]  [address] ? [address] [address], , [place]   [address] °[phone] ? [phone] ? FAX [phone] °Web site:   [email or web address] °

## 2016-09-07 NOTE — Discharge Summary (Signed)
Physician Discharge Summary  Patient ID: Nicole Lloyd MRN: [id number] DOB/AGE: Jun 01, 1946 70 y.o.  Admit date: 09/06/2016 Discharge date: 09/07/2016  Admission Diagnoses: Chronic calculus cholecystitis Discharge Diagnoses: same Active Problems:   Chronic cholecystitis with calculus   Discharged Condition: good  Hospital Course: Lap chole with [place] 10/17 for chronic calculus cholecystitis.  Kept overnight on telemetry because of significant PMH.  No events overnight.  Tolerating diet.  Pain well-controlled.  Ready for discharge  Consults: None  Significant Diagnostic Studies: Dg Cholangiogram Operative  Result Date: 09/06/2016 CLINICAL DATA:  Chronic cholecystitis with calculus EXAM: INTRAOPERATIVE CHOLANGIOGRAM TECHNIQUE: Cholangiographic images from the C-arm fluoroscopic device were submitted for interpretation post-operatively. Please see the procedural report for the amount of contrast and the fluoroscopy time utilized. COMPARISON:  Ultrasound 05/07/2016 FINDINGS: No persistent filling defects in the common duct. Intrahepatic ducts are incompletely visualized, appearing decompressed centrally. Contrast passes into the duodenum. : Negative for retained common duct stone. Electronically Signed   By: Lucrezia Europe M.D.   On: 09/06/2016 09:34     Treatments: Lap chole with IOC  Discharge Exam: Blood pressure (!) 112/52, pulse 64, temperature 98.5 F (36.9 C), temperature source Oral, resp. rate 17, height 5\' 5"  (1.651 m), weight 63.3 kg (139 lb 9 oz), SpO2 96 %. General appearance: alert, cooperative and no distress Resp: clear to auscultation bilaterally Cardio: regular rate and rhythm, S1, S2 normal, no murmur, click, rub or gallop GI: soft, some incisional tenderness; no distention Incisions c/d/i  Disposition: 01-Home or Self Care  Discharge Instructions    Call MD for:  persistant nausea and vomiting    Complete by:  As directed    Call MD for:  redness, tenderness, or  signs of infection (pain, swelling, redness, odor or green/yellow discharge around incision site)    Complete by:  As directed    Call MD for:  severe uncontrolled pain    Complete by:  As directed    Call MD for:  temperature >100.4    Complete by:  As directed    Diet general    Complete by:  As directed    Driving Restrictions    Complete by:  As directed    Do not drive while taking pain medications   Increase activity slowly    Complete by:  As directed    May shower / Bathe    Complete by:  As directed        Medication List    TAKE these medications   alendronate 70 MG tablet Commonly known as:  FOSAMAX Take 70 mg by mouth once a week.   ALPRAZolam 0.25 MG tablet Commonly known as:  XANAX Take 0.25 mg by mouth at bedtime.   aspirin 81 MG tablet Take 81 mg by mouth daily.   clotrimazole-betamethasone cream Commonly known as:  LOTRISONE Apply 1 application topically daily as needed (rash).   dipyridamole-aspirin 200-25 MG 12hr capsule Commonly known as:  AGGRENOX Take 1 capsule by mouth 2 (two) times daily.   HYDROcodone-acetaminophen 5-325 MG tablet Commonly known as:  NORCO/VICODIN Take 1-2 tablets by mouth every 4 (four) hours as needed for moderate pain.   metFORMIN 1000 MG tablet Commonly known as:  GLUCOPHAGE Take 1,000 mg by mouth 2 (two) times daily with a meal.   metoprolol 50 MG tablet Commonly known as:  LOPRESSOR Take 25 mg by mouth 2 (two) times daily.   ondansetron 4 MG tablet Commonly known as:  ZOFRAN Take 1 tablet (  4 mg total) by mouth every 6 (six) hours as needed for nausea or vomiting.   pantoprazole 40 MG tablet Commonly known as:  PROTONIX Take 1 tablet (40 mg total) by mouth daily.   sucralfate 1 g tablet Commonly known as:  CARAFATE Take 1 tablet (1 g total) by mouth 4 (four) times daily -  with meals and at bedtime.   traZODone 50 MG tablet Commonly known as:  DESYREL Take 50 mg by mouth at bedtime.      Follow-up  Information    Clayton Jarmon K., MD Follow up in 3 week(s).   Specialty:  General Surgery Contact information: [address] [place] [address] [phone]           Signed: Maia Petties. 09/07/2016, 9:41 AM

## 2016-09-07 NOTE — Progress Notes (Signed)
Pt. Placed on CPAP for h/s use, per home settings, humidity filled, on room air, own hose/mask, tolerating well, RT to monitor.

## 2016-09-20 DIAGNOSIS — E7800 Pure hypercholesterolemia, unspecified: Secondary | ICD-10-CM | POA: Diagnosis not present

## 2016-09-20 DIAGNOSIS — R0789 Other chest pain: Secondary | ICD-10-CM | POA: Diagnosis not present

## 2016-09-20 DIAGNOSIS — I10 Essential (primary) hypertension: Secondary | ICD-10-CM | POA: Diagnosis not present

## 2016-09-20 DIAGNOSIS — I2510 Atherosclerotic heart disease of native coronary artery without angina pectoris: Secondary | ICD-10-CM | POA: Diagnosis not present

## 2016-11-29 DIAGNOSIS — J101 Influenza due to other identified influenza virus with other respiratory manifestations: Secondary | ICD-10-CM | POA: Diagnosis not present

## 2016-11-29 DIAGNOSIS — R05 Cough: Secondary | ICD-10-CM | POA: Diagnosis not present

## 2016-12-04 DIAGNOSIS — R627 Adult failure to thrive: Secondary | ICD-10-CM | POA: Diagnosis not present

## 2016-12-04 DIAGNOSIS — R6510 Systemic inflammatory response syndrome (SIRS) of non-infectious origin without acute organ dysfunction: Secondary | ICD-10-CM | POA: Diagnosis not present

## 2016-12-04 DIAGNOSIS — J111 Influenza due to unidentified influenza virus with other respiratory manifestations: Secondary | ICD-10-CM | POA: Diagnosis not present

## 2016-12-04 DIAGNOSIS — A419 Sepsis, unspecified organism: Secondary | ICD-10-CM | POA: Diagnosis not present

## 2016-12-04 DIAGNOSIS — T84218A Breakdown (mechanical) of internal fixation device of other bones, initial encounter: Secondary | ICD-10-CM | POA: Diagnosis not present

## 2016-12-04 DIAGNOSIS — K219 Gastro-esophageal reflux disease without esophagitis: Secondary | ICD-10-CM | POA: Diagnosis not present

## 2016-12-04 DIAGNOSIS — R918 Other nonspecific abnormal finding of lung field: Secondary | ICD-10-CM | POA: Diagnosis not present

## 2016-12-04 DIAGNOSIS — I25810 Atherosclerosis of coronary artery bypass graft(s) without angina pectoris: Secondary | ICD-10-CM | POA: Diagnosis not present

## 2016-12-04 DIAGNOSIS — N183 Chronic kidney disease, stage 3 (moderate): Secondary | ICD-10-CM | POA: Diagnosis not present

## 2016-12-04 DIAGNOSIS — E876 Hypokalemia: Secondary | ICD-10-CM | POA: Diagnosis not present

## 2016-12-04 DIAGNOSIS — E871 Hypo-osmolality and hyponatremia: Secondary | ICD-10-CM | POA: Diagnosis not present

## 2016-12-04 DIAGNOSIS — I998 Other disorder of circulatory system: Secondary | ICD-10-CM | POA: Diagnosis not present

## 2016-12-04 DIAGNOSIS — E43 Unspecified severe protein-calorie malnutrition: Secondary | ICD-10-CM | POA: Diagnosis not present

## 2016-12-04 DIAGNOSIS — D631 Anemia in chronic kidney disease: Secondary | ICD-10-CM | POA: Diagnosis not present

## 2016-12-04 DIAGNOSIS — R05 Cough: Secondary | ICD-10-CM | POA: Diagnosis not present

## 2016-12-04 DIAGNOSIS — J09X1 Influenza due to identified novel influenza A virus with pneumonia: Secondary | ICD-10-CM | POA: Diagnosis not present

## 2016-12-04 DIAGNOSIS — J09X2 Influenza due to identified novel influenza A virus with other respiratory manifestations: Secondary | ICD-10-CM | POA: Diagnosis not present

## 2016-12-04 DIAGNOSIS — J159 Unspecified bacterial pneumonia: Secondary | ICD-10-CM | POA: Diagnosis not present

## 2016-12-04 DIAGNOSIS — R112 Nausea with vomiting, unspecified: Secondary | ICD-10-CM | POA: Diagnosis not present

## 2016-12-04 DIAGNOSIS — R000 Tachycardia, unspecified: Secondary | ICD-10-CM | POA: Diagnosis not present

## 2016-12-04 DIAGNOSIS — R197 Diarrhea, unspecified: Secondary | ICD-10-CM | POA: Diagnosis not present

## 2016-12-04 DIAGNOSIS — R443 Hallucinations, unspecified: Secondary | ICD-10-CM | POA: Diagnosis not present

## 2016-12-04 DIAGNOSIS — R634 Abnormal weight loss: Secondary | ICD-10-CM | POA: Diagnosis not present

## 2016-12-04 DIAGNOSIS — J189 Pneumonia, unspecified organism: Secondary | ICD-10-CM | POA: Diagnosis not present

## 2016-12-04 DIAGNOSIS — Z7984 Long term (current) use of oral hypoglycemic drugs: Secondary | ICD-10-CM | POA: Diagnosis not present

## 2016-12-04 DIAGNOSIS — Z681 Body mass index (BMI) 19 or less, adult: Secondary | ICD-10-CM | POA: Diagnosis not present

## 2016-12-04 DIAGNOSIS — E1122 Type 2 diabetes mellitus with diabetic chronic kidney disease: Secondary | ICD-10-CM | POA: Diagnosis not present

## 2016-12-04 DIAGNOSIS — D649 Anemia, unspecified: Secondary | ICD-10-CM | POA: Diagnosis not present

## 2016-12-05 DIAGNOSIS — R05 Cough: Secondary | ICD-10-CM | POA: Diagnosis not present

## 2016-12-05 DIAGNOSIS — R627 Adult failure to thrive: Secondary | ICD-10-CM | POA: Diagnosis not present

## 2016-12-05 DIAGNOSIS — I998 Other disorder of circulatory system: Secondary | ICD-10-CM | POA: Diagnosis not present

## 2016-12-05 DIAGNOSIS — R000 Tachycardia, unspecified: Secondary | ICD-10-CM | POA: Diagnosis not present

## 2016-12-05 DIAGNOSIS — D649 Anemia, unspecified: Secondary | ICD-10-CM | POA: Diagnosis not present

## 2016-12-05 DIAGNOSIS — R6510 Systemic inflammatory response syndrome (SIRS) of non-infectious origin without acute organ dysfunction: Secondary | ICD-10-CM | POA: Diagnosis not present

## 2016-12-05 DIAGNOSIS — J09X2 Influenza due to identified novel influenza A virus with other respiratory manifestations: Secondary | ICD-10-CM | POA: Diagnosis not present

## 2016-12-06 DIAGNOSIS — J111 Influenza due to unidentified influenza virus with other respiratory manifestations: Secondary | ICD-10-CM | POA: Diagnosis not present

## 2016-12-06 DIAGNOSIS — D649 Anemia, unspecified: Secondary | ICD-10-CM | POA: Diagnosis not present

## 2016-12-06 DIAGNOSIS — R627 Adult failure to thrive: Secondary | ICD-10-CM | POA: Diagnosis not present

## 2016-12-06 DIAGNOSIS — E43 Unspecified severe protein-calorie malnutrition: Secondary | ICD-10-CM | POA: Diagnosis not present

## 2016-12-06 DIAGNOSIS — R05 Cough: Secondary | ICD-10-CM | POA: Diagnosis not present

## 2016-12-07 DIAGNOSIS — D649 Anemia, unspecified: Secondary | ICD-10-CM | POA: Diagnosis not present

## 2016-12-07 DIAGNOSIS — R627 Adult failure to thrive: Secondary | ICD-10-CM | POA: Diagnosis not present

## 2016-12-07 DIAGNOSIS — J111 Influenza due to unidentified influenza virus with other respiratory manifestations: Secondary | ICD-10-CM | POA: Diagnosis not present

## 2016-12-07 DIAGNOSIS — E43 Unspecified severe protein-calorie malnutrition: Secondary | ICD-10-CM | POA: Diagnosis not present

## 2016-12-07 DIAGNOSIS — R05 Cough: Secondary | ICD-10-CM | POA: Diagnosis not present

## 2016-12-08 DIAGNOSIS — D649 Anemia, unspecified: Secondary | ICD-10-CM | POA: Diagnosis not present

## 2016-12-08 DIAGNOSIS — R627 Adult failure to thrive: Secondary | ICD-10-CM | POA: Diagnosis not present

## 2016-12-08 DIAGNOSIS — R05 Cough: Secondary | ICD-10-CM | POA: Diagnosis not present

## 2016-12-08 DIAGNOSIS — J111 Influenza due to unidentified influenza virus with other respiratory manifestations: Secondary | ICD-10-CM | POA: Diagnosis not present

## 2016-12-08 DIAGNOSIS — E43 Unspecified severe protein-calorie malnutrition: Secondary | ICD-10-CM | POA: Diagnosis not present

## 2016-12-09 DIAGNOSIS — J189 Pneumonia, unspecified organism: Secondary | ICD-10-CM | POA: Diagnosis not present

## 2016-12-09 DIAGNOSIS — J111 Influenza due to unidentified influenza virus with other respiratory manifestations: Secondary | ICD-10-CM | POA: Diagnosis not present

## 2016-12-09 DIAGNOSIS — R197 Diarrhea, unspecified: Secondary | ICD-10-CM | POA: Diagnosis not present

## 2016-12-09 DIAGNOSIS — E43 Unspecified severe protein-calorie malnutrition: Secondary | ICD-10-CM | POA: Diagnosis not present

## 2016-12-09 DIAGNOSIS — R112 Nausea with vomiting, unspecified: Secondary | ICD-10-CM | POA: Diagnosis not present

## 2016-12-11 DIAGNOSIS — I69398 Other sequelae of cerebral infarction: Secondary | ICD-10-CM | POA: Diagnosis not present

## 2016-12-11 DIAGNOSIS — M545 Low back pain: Secondary | ICD-10-CM | POA: Diagnosis not present

## 2016-12-11 DIAGNOSIS — I129 Hypertensive chronic kidney disease with stage 1 through stage 4 chronic kidney disease, or unspecified chronic kidney disease: Secondary | ICD-10-CM | POA: Diagnosis not present

## 2016-12-11 DIAGNOSIS — I2510 Atherosclerotic heart disease of native coronary artery without angina pectoris: Secondary | ICD-10-CM | POA: Diagnosis not present

## 2016-12-11 DIAGNOSIS — D649 Anemia, unspecified: Secondary | ICD-10-CM | POA: Diagnosis not present

## 2016-12-11 DIAGNOSIS — N189 Chronic kidney disease, unspecified: Secondary | ICD-10-CM | POA: Diagnosis not present

## 2016-12-11 DIAGNOSIS — E785 Hyperlipidemia, unspecified: Secondary | ICD-10-CM | POA: Diagnosis not present

## 2016-12-11 DIAGNOSIS — E1122 Type 2 diabetes mellitus with diabetic chronic kidney disease: Secondary | ICD-10-CM | POA: Diagnosis not present

## 2016-12-11 DIAGNOSIS — M6281 Muscle weakness (generalized): Secondary | ICD-10-CM | POA: Diagnosis not present

## 2016-12-11 DIAGNOSIS — E43 Unspecified severe protein-calorie malnutrition: Secondary | ICD-10-CM | POA: Diagnosis not present

## 2016-12-20 DIAGNOSIS — R69 Illness, unspecified: Secondary | ICD-10-CM | POA: Diagnosis not present

## 2016-12-20 DIAGNOSIS — Z951 Presence of aortocoronary bypass graft: Secondary | ICD-10-CM | POA: Diagnosis not present

## 2016-12-20 DIAGNOSIS — E43 Unspecified severe protein-calorie malnutrition: Secondary | ICD-10-CM | POA: Diagnosis not present

## 2016-12-20 DIAGNOSIS — M6281 Muscle weakness (generalized): Secondary | ICD-10-CM | POA: Diagnosis not present

## 2016-12-20 DIAGNOSIS — I252 Old myocardial infarction: Secondary | ICD-10-CM | POA: Diagnosis not present

## 2016-12-20 DIAGNOSIS — N189 Chronic kidney disease, unspecified: Secondary | ICD-10-CM | POA: Diagnosis not present

## 2016-12-20 DIAGNOSIS — I2510 Atherosclerotic heart disease of native coronary artery without angina pectoris: Secondary | ICD-10-CM | POA: Diagnosis not present

## 2016-12-20 DIAGNOSIS — Z8673 Personal history of transient ischemic attack (TIA), and cerebral infarction without residual deficits: Secondary | ICD-10-CM | POA: Diagnosis not present

## 2016-12-20 DIAGNOSIS — E785 Hyperlipidemia, unspecified: Secondary | ICD-10-CM | POA: Diagnosis not present

## 2016-12-20 DIAGNOSIS — N39498 Other specified urinary incontinence: Secondary | ICD-10-CM | POA: Diagnosis not present

## 2016-12-20 DIAGNOSIS — J309 Allergic rhinitis, unspecified: Secondary | ICD-10-CM | POA: Diagnosis not present

## 2016-12-20 DIAGNOSIS — Z9181 History of falling: Secondary | ICD-10-CM | POA: Diagnosis not present

## 2016-12-20 DIAGNOSIS — E7800 Pure hypercholesterolemia, unspecified: Secondary | ICD-10-CM | POA: Diagnosis not present

## 2016-12-20 DIAGNOSIS — M545 Low back pain: Secondary | ICD-10-CM | POA: Diagnosis not present

## 2016-12-20 DIAGNOSIS — H18419 Arcus senilis, unspecified eye: Secondary | ICD-10-CM | POA: Diagnosis not present

## 2016-12-20 DIAGNOSIS — Z0000 Encounter for general adult medical examination without abnormal findings: Secondary | ICD-10-CM | POA: Diagnosis not present

## 2016-12-20 DIAGNOSIS — J40 Bronchitis, not specified as acute or chronic: Secondary | ICD-10-CM | POA: Diagnosis not present

## 2016-12-20 DIAGNOSIS — E1122 Type 2 diabetes mellitus with diabetic chronic kidney disease: Secondary | ICD-10-CM | POA: Diagnosis not present

## 2016-12-20 DIAGNOSIS — Z7982 Long term (current) use of aspirin: Secondary | ICD-10-CM | POA: Diagnosis not present

## 2016-12-20 DIAGNOSIS — Z8701 Personal history of pneumonia (recurrent): Secondary | ICD-10-CM | POA: Diagnosis not present

## 2016-12-20 DIAGNOSIS — Z7984 Long term (current) use of oral hypoglycemic drugs: Secondary | ICD-10-CM | POA: Diagnosis not present

## 2016-12-20 DIAGNOSIS — I129 Hypertensive chronic kidney disease with stage 1 through stage 4 chronic kidney disease, or unspecified chronic kidney disease: Secondary | ICD-10-CM | POA: Diagnosis not present

## 2016-12-20 DIAGNOSIS — I69398 Other sequelae of cerebral infarction: Secondary | ICD-10-CM | POA: Diagnosis not present

## 2016-12-20 DIAGNOSIS — D649 Anemia, unspecified: Secondary | ICD-10-CM | POA: Diagnosis not present

## 2016-12-20 DIAGNOSIS — E119 Type 2 diabetes mellitus without complications: Secondary | ICD-10-CM | POA: Diagnosis not present

## 2016-12-20 DIAGNOSIS — I959 Hypotension, unspecified: Secondary | ICD-10-CM | POA: Diagnosis not present

## 2016-12-23 DIAGNOSIS — D649 Anemia, unspecified: Secondary | ICD-10-CM | POA: Diagnosis not present

## 2016-12-23 DIAGNOSIS — I69398 Other sequelae of cerebral infarction: Secondary | ICD-10-CM | POA: Diagnosis not present

## 2016-12-23 DIAGNOSIS — I129 Hypertensive chronic kidney disease with stage 1 through stage 4 chronic kidney disease, or unspecified chronic kidney disease: Secondary | ICD-10-CM | POA: Diagnosis not present

## 2016-12-23 DIAGNOSIS — E1122 Type 2 diabetes mellitus with diabetic chronic kidney disease: Secondary | ICD-10-CM | POA: Diagnosis not present

## 2016-12-23 DIAGNOSIS — I2510 Atherosclerotic heart disease of native coronary artery without angina pectoris: Secondary | ICD-10-CM | POA: Diagnosis not present

## 2016-12-23 DIAGNOSIS — N189 Chronic kidney disease, unspecified: Secondary | ICD-10-CM | POA: Diagnosis not present

## 2016-12-23 DIAGNOSIS — M6281 Muscle weakness (generalized): Secondary | ICD-10-CM | POA: Diagnosis not present

## 2016-12-23 DIAGNOSIS — M545 Low back pain: Secondary | ICD-10-CM | POA: Diagnosis not present

## 2016-12-23 DIAGNOSIS — E43 Unspecified severe protein-calorie malnutrition: Secondary | ICD-10-CM | POA: Diagnosis not present

## 2016-12-23 DIAGNOSIS — E785 Hyperlipidemia, unspecified: Secondary | ICD-10-CM | POA: Diagnosis not present

## 2016-12-26 DIAGNOSIS — Z961 Presence of intraocular lens: Secondary | ICD-10-CM | POA: Diagnosis not present

## 2016-12-28 DIAGNOSIS — I69398 Other sequelae of cerebral infarction: Secondary | ICD-10-CM | POA: Diagnosis not present

## 2016-12-28 DIAGNOSIS — M545 Low back pain: Secondary | ICD-10-CM | POA: Diagnosis not present

## 2016-12-28 DIAGNOSIS — E785 Hyperlipidemia, unspecified: Secondary | ICD-10-CM | POA: Diagnosis not present

## 2016-12-28 DIAGNOSIS — I2510 Atherosclerotic heart disease of native coronary artery without angina pectoris: Secondary | ICD-10-CM | POA: Diagnosis not present

## 2016-12-28 DIAGNOSIS — M6281 Muscle weakness (generalized): Secondary | ICD-10-CM | POA: Diagnosis not present

## 2016-12-28 DIAGNOSIS — N189 Chronic kidney disease, unspecified: Secondary | ICD-10-CM | POA: Diagnosis not present

## 2016-12-28 DIAGNOSIS — E43 Unspecified severe protein-calorie malnutrition: Secondary | ICD-10-CM | POA: Diagnosis not present

## 2016-12-28 DIAGNOSIS — E1122 Type 2 diabetes mellitus with diabetic chronic kidney disease: Secondary | ICD-10-CM | POA: Diagnosis not present

## 2016-12-28 DIAGNOSIS — D649 Anemia, unspecified: Secondary | ICD-10-CM | POA: Diagnosis not present

## 2016-12-28 DIAGNOSIS — I129 Hypertensive chronic kidney disease with stage 1 through stage 4 chronic kidney disease, or unspecified chronic kidney disease: Secondary | ICD-10-CM | POA: Diagnosis not present

## 2016-12-30 DIAGNOSIS — M545 Low back pain: Secondary | ICD-10-CM | POA: Diagnosis not present

## 2016-12-30 DIAGNOSIS — I6509 Occlusion and stenosis of unspecified vertebral artery: Secondary | ICD-10-CM | POA: Diagnosis not present

## 2016-12-30 DIAGNOSIS — Z8673 Personal history of transient ischemic attack (TIA), and cerebral infarction without residual deficits: Secondary | ICD-10-CM | POA: Diagnosis not present

## 2016-12-30 DIAGNOSIS — R29898 Other symptoms and signs involving the musculoskeletal system: Secondary | ICD-10-CM | POA: Diagnosis not present

## 2016-12-30 DIAGNOSIS — M6281 Muscle weakness (generalized): Secondary | ICD-10-CM | POA: Diagnosis not present

## 2016-12-30 DIAGNOSIS — N189 Chronic kidney disease, unspecified: Secondary | ICD-10-CM | POA: Diagnosis not present

## 2016-12-30 DIAGNOSIS — I10 Essential (primary) hypertension: Secondary | ICD-10-CM | POA: Diagnosis not present

## 2016-12-30 DIAGNOSIS — I2510 Atherosclerotic heart disease of native coronary artery without angina pectoris: Secondary | ICD-10-CM | POA: Diagnosis not present

## 2016-12-30 DIAGNOSIS — D649 Anemia, unspecified: Secondary | ICD-10-CM | POA: Diagnosis not present

## 2016-12-30 DIAGNOSIS — E785 Hyperlipidemia, unspecified: Secondary | ICD-10-CM | POA: Diagnosis not present

## 2016-12-30 DIAGNOSIS — Z7984 Long term (current) use of oral hypoglycemic drugs: Secondary | ICD-10-CM | POA: Diagnosis not present

## 2016-12-30 DIAGNOSIS — E43 Unspecified severe protein-calorie malnutrition: Secondary | ICD-10-CM | POA: Diagnosis not present

## 2016-12-30 DIAGNOSIS — E1122 Type 2 diabetes mellitus with diabetic chronic kidney disease: Secondary | ICD-10-CM | POA: Diagnosis not present

## 2016-12-30 DIAGNOSIS — I69398 Other sequelae of cerebral infarction: Secondary | ICD-10-CM | POA: Diagnosis not present

## 2016-12-30 DIAGNOSIS — I129 Hypertensive chronic kidney disease with stage 1 through stage 4 chronic kidney disease, or unspecified chronic kidney disease: Secondary | ICD-10-CM | POA: Diagnosis not present

## 2016-12-30 DIAGNOSIS — Z79899 Other long term (current) drug therapy: Secondary | ICD-10-CM | POA: Diagnosis not present

## 2016-12-30 DIAGNOSIS — E119 Type 2 diabetes mellitus without complications: Secondary | ICD-10-CM | POA: Diagnosis not present

## 2016-12-30 DIAGNOSIS — Z7982 Long term (current) use of aspirin: Secondary | ICD-10-CM | POA: Diagnosis not present

## 2017-01-03 DIAGNOSIS — E785 Hyperlipidemia, unspecified: Secondary | ICD-10-CM | POA: Diagnosis not present

## 2017-01-03 DIAGNOSIS — E1122 Type 2 diabetes mellitus with diabetic chronic kidney disease: Secondary | ICD-10-CM | POA: Diagnosis not present

## 2017-01-03 DIAGNOSIS — D649 Anemia, unspecified: Secondary | ICD-10-CM | POA: Diagnosis not present

## 2017-01-03 DIAGNOSIS — I129 Hypertensive chronic kidney disease with stage 1 through stage 4 chronic kidney disease, or unspecified chronic kidney disease: Secondary | ICD-10-CM | POA: Diagnosis not present

## 2017-01-03 DIAGNOSIS — I69398 Other sequelae of cerebral infarction: Secondary | ICD-10-CM | POA: Diagnosis not present

## 2017-01-03 DIAGNOSIS — N189 Chronic kidney disease, unspecified: Secondary | ICD-10-CM | POA: Diagnosis not present

## 2017-01-03 DIAGNOSIS — I2510 Atherosclerotic heart disease of native coronary artery without angina pectoris: Secondary | ICD-10-CM | POA: Diagnosis not present

## 2017-01-03 DIAGNOSIS — M545 Low back pain: Secondary | ICD-10-CM | POA: Diagnosis not present

## 2017-01-03 DIAGNOSIS — E43 Unspecified severe protein-calorie malnutrition: Secondary | ICD-10-CM | POA: Diagnosis not present

## 2017-01-03 DIAGNOSIS — M6281 Muscle weakness (generalized): Secondary | ICD-10-CM | POA: Diagnosis not present

## 2017-01-06 DIAGNOSIS — I2510 Atherosclerotic heart disease of native coronary artery without angina pectoris: Secondary | ICD-10-CM | POA: Diagnosis not present

## 2017-01-06 DIAGNOSIS — I129 Hypertensive chronic kidney disease with stage 1 through stage 4 chronic kidney disease, or unspecified chronic kidney disease: Secondary | ICD-10-CM | POA: Diagnosis not present

## 2017-01-06 DIAGNOSIS — E785 Hyperlipidemia, unspecified: Secondary | ICD-10-CM | POA: Diagnosis not present

## 2017-01-06 DIAGNOSIS — D649 Anemia, unspecified: Secondary | ICD-10-CM | POA: Diagnosis not present

## 2017-01-06 DIAGNOSIS — M545 Low back pain: Secondary | ICD-10-CM | POA: Diagnosis not present

## 2017-01-06 DIAGNOSIS — E43 Unspecified severe protein-calorie malnutrition: Secondary | ICD-10-CM | POA: Diagnosis not present

## 2017-01-06 DIAGNOSIS — M6281 Muscle weakness (generalized): Secondary | ICD-10-CM | POA: Diagnosis not present

## 2017-01-06 DIAGNOSIS — E1122 Type 2 diabetes mellitus with diabetic chronic kidney disease: Secondary | ICD-10-CM | POA: Diagnosis not present

## 2017-01-06 DIAGNOSIS — N189 Chronic kidney disease, unspecified: Secondary | ICD-10-CM | POA: Diagnosis not present

## 2017-01-06 DIAGNOSIS — I69398 Other sequelae of cerebral infarction: Secondary | ICD-10-CM | POA: Diagnosis not present

## 2017-01-09 DIAGNOSIS — E119 Type 2 diabetes mellitus without complications: Secondary | ICD-10-CM | POA: Diagnosis not present

## 2017-01-09 DIAGNOSIS — R627 Adult failure to thrive: Secondary | ICD-10-CM | POA: Diagnosis not present

## 2017-01-09 DIAGNOSIS — I2510 Atherosclerotic heart disease of native coronary artery without angina pectoris: Secondary | ICD-10-CM | POA: Diagnosis not present

## 2017-01-09 DIAGNOSIS — I69351 Hemiplegia and hemiparesis following cerebral infarction affecting right dominant side: Secondary | ICD-10-CM | POA: Diagnosis not present

## 2017-01-09 DIAGNOSIS — I69823 Fluency disorder following other cerebrovascular disease: Secondary | ICD-10-CM | POA: Diagnosis not present

## 2017-01-09 DIAGNOSIS — Z7982 Long term (current) use of aspirin: Secondary | ICD-10-CM | POA: Diagnosis not present

## 2017-01-09 DIAGNOSIS — D649 Anemia, unspecified: Secondary | ICD-10-CM | POA: Diagnosis not present

## 2017-01-09 DIAGNOSIS — Z7902 Long term (current) use of antithrombotics/antiplatelets: Secondary | ICD-10-CM | POA: Diagnosis not present

## 2017-01-09 DIAGNOSIS — Z951 Presence of aortocoronary bypass graft: Secondary | ICD-10-CM | POA: Diagnosis not present

## 2017-01-09 DIAGNOSIS — Z0000 Encounter for general adult medical examination without abnormal findings: Secondary | ICD-10-CM | POA: Diagnosis not present

## 2017-01-09 DIAGNOSIS — G4700 Insomnia, unspecified: Secondary | ICD-10-CM | POA: Diagnosis not present

## 2017-01-09 DIAGNOSIS — I639 Cerebral infarction, unspecified: Secondary | ICD-10-CM | POA: Diagnosis not present

## 2017-01-11 DIAGNOSIS — I2510 Atherosclerotic heart disease of native coronary artery without angina pectoris: Secondary | ICD-10-CM | POA: Diagnosis not present

## 2017-01-11 DIAGNOSIS — E43 Unspecified severe protein-calorie malnutrition: Secondary | ICD-10-CM | POA: Diagnosis not present

## 2017-01-11 DIAGNOSIS — I69398 Other sequelae of cerebral infarction: Secondary | ICD-10-CM | POA: Diagnosis not present

## 2017-01-11 DIAGNOSIS — I129 Hypertensive chronic kidney disease with stage 1 through stage 4 chronic kidney disease, or unspecified chronic kidney disease: Secondary | ICD-10-CM | POA: Diagnosis not present

## 2017-01-11 DIAGNOSIS — D649 Anemia, unspecified: Secondary | ICD-10-CM | POA: Diagnosis not present

## 2017-01-11 DIAGNOSIS — E785 Hyperlipidemia, unspecified: Secondary | ICD-10-CM | POA: Diagnosis not present

## 2017-01-11 DIAGNOSIS — N189 Chronic kidney disease, unspecified: Secondary | ICD-10-CM | POA: Diagnosis not present

## 2017-01-11 DIAGNOSIS — M6281 Muscle weakness (generalized): Secondary | ICD-10-CM | POA: Diagnosis not present

## 2017-01-11 DIAGNOSIS — M545 Low back pain: Secondary | ICD-10-CM | POA: Diagnosis not present

## 2017-01-11 DIAGNOSIS — E1122 Type 2 diabetes mellitus with diabetic chronic kidney disease: Secondary | ICD-10-CM | POA: Diagnosis not present

## 2017-01-13 DIAGNOSIS — E1122 Type 2 diabetes mellitus with diabetic chronic kidney disease: Secondary | ICD-10-CM | POA: Diagnosis not present

## 2017-01-13 DIAGNOSIS — E785 Hyperlipidemia, unspecified: Secondary | ICD-10-CM | POA: Diagnosis not present

## 2017-01-13 DIAGNOSIS — M545 Low back pain: Secondary | ICD-10-CM | POA: Diagnosis not present

## 2017-01-13 DIAGNOSIS — I2510 Atherosclerotic heart disease of native coronary artery without angina pectoris: Secondary | ICD-10-CM | POA: Diagnosis not present

## 2017-01-13 DIAGNOSIS — I129 Hypertensive chronic kidney disease with stage 1 through stage 4 chronic kidney disease, or unspecified chronic kidney disease: Secondary | ICD-10-CM | POA: Diagnosis not present

## 2017-01-13 DIAGNOSIS — D649 Anemia, unspecified: Secondary | ICD-10-CM | POA: Diagnosis not present

## 2017-01-13 DIAGNOSIS — I69398 Other sequelae of cerebral infarction: Secondary | ICD-10-CM | POA: Diagnosis not present

## 2017-01-13 DIAGNOSIS — N189 Chronic kidney disease, unspecified: Secondary | ICD-10-CM | POA: Diagnosis not present

## 2017-01-13 DIAGNOSIS — M6281 Muscle weakness (generalized): Secondary | ICD-10-CM | POA: Diagnosis not present

## 2017-01-13 DIAGNOSIS — E43 Unspecified severe protein-calorie malnutrition: Secondary | ICD-10-CM | POA: Diagnosis not present

## 2017-01-18 DIAGNOSIS — K219 Gastro-esophageal reflux disease without esophagitis: Secondary | ICD-10-CM | POA: Diagnosis not present

## 2017-01-18 DIAGNOSIS — E7800 Pure hypercholesterolemia, unspecified: Secondary | ICD-10-CM | POA: Diagnosis not present

## 2017-01-18 DIAGNOSIS — G4733 Obstructive sleep apnea (adult) (pediatric): Secondary | ICD-10-CM | POA: Diagnosis not present

## 2017-01-18 DIAGNOSIS — I2510 Atherosclerotic heart disease of native coronary artery without angina pectoris: Secondary | ICD-10-CM | POA: Diagnosis not present

## 2017-01-19 DIAGNOSIS — R29898 Other symptoms and signs involving the musculoskeletal system: Secondary | ICD-10-CM | POA: Diagnosis not present

## 2017-01-25 DIAGNOSIS — E43 Unspecified severe protein-calorie malnutrition: Secondary | ICD-10-CM | POA: Diagnosis not present

## 2017-01-25 DIAGNOSIS — M545 Low back pain: Secondary | ICD-10-CM | POA: Diagnosis not present

## 2017-01-25 DIAGNOSIS — M6281 Muscle weakness (generalized): Secondary | ICD-10-CM | POA: Diagnosis not present

## 2017-01-25 DIAGNOSIS — I129 Hypertensive chronic kidney disease with stage 1 through stage 4 chronic kidney disease, or unspecified chronic kidney disease: Secondary | ICD-10-CM | POA: Diagnosis not present

## 2017-01-25 DIAGNOSIS — I69398 Other sequelae of cerebral infarction: Secondary | ICD-10-CM | POA: Diagnosis not present

## 2017-01-25 DIAGNOSIS — D649 Anemia, unspecified: Secondary | ICD-10-CM | POA: Diagnosis not present

## 2017-01-25 DIAGNOSIS — E1122 Type 2 diabetes mellitus with diabetic chronic kidney disease: Secondary | ICD-10-CM | POA: Diagnosis not present

## 2017-01-25 DIAGNOSIS — E785 Hyperlipidemia, unspecified: Secondary | ICD-10-CM | POA: Diagnosis not present

## 2017-01-25 DIAGNOSIS — N189 Chronic kidney disease, unspecified: Secondary | ICD-10-CM | POA: Diagnosis not present

## 2017-01-25 DIAGNOSIS — I2510 Atherosclerotic heart disease of native coronary artery without angina pectoris: Secondary | ICD-10-CM | POA: Diagnosis not present

## 2017-01-27 DIAGNOSIS — I2510 Atherosclerotic heart disease of native coronary artery without angina pectoris: Secondary | ICD-10-CM | POA: Diagnosis not present

## 2017-01-27 DIAGNOSIS — N189 Chronic kidney disease, unspecified: Secondary | ICD-10-CM | POA: Diagnosis not present

## 2017-01-27 DIAGNOSIS — I129 Hypertensive chronic kidney disease with stage 1 through stage 4 chronic kidney disease, or unspecified chronic kidney disease: Secondary | ICD-10-CM | POA: Diagnosis not present

## 2017-01-27 DIAGNOSIS — E1122 Type 2 diabetes mellitus with diabetic chronic kidney disease: Secondary | ICD-10-CM | POA: Diagnosis not present

## 2017-01-27 DIAGNOSIS — D649 Anemia, unspecified: Secondary | ICD-10-CM | POA: Diagnosis not present

## 2017-01-27 DIAGNOSIS — E43 Unspecified severe protein-calorie malnutrition: Secondary | ICD-10-CM | POA: Diagnosis not present

## 2017-01-27 DIAGNOSIS — M545 Low back pain: Secondary | ICD-10-CM | POA: Diagnosis not present

## 2017-01-27 DIAGNOSIS — I69398 Other sequelae of cerebral infarction: Secondary | ICD-10-CM | POA: Diagnosis not present

## 2017-01-27 DIAGNOSIS — E785 Hyperlipidemia, unspecified: Secondary | ICD-10-CM | POA: Diagnosis not present

## 2017-01-27 DIAGNOSIS — M6281 Muscle weakness (generalized): Secondary | ICD-10-CM | POA: Diagnosis not present

## 2017-01-28 DIAGNOSIS — M6281 Muscle weakness (generalized): Secondary | ICD-10-CM | POA: Diagnosis not present

## 2017-01-28 DIAGNOSIS — E43 Unspecified severe protein-calorie malnutrition: Secondary | ICD-10-CM | POA: Diagnosis not present

## 2017-01-28 DIAGNOSIS — I129 Hypertensive chronic kidney disease with stage 1 through stage 4 chronic kidney disease, or unspecified chronic kidney disease: Secondary | ICD-10-CM | POA: Diagnosis not present

## 2017-01-28 DIAGNOSIS — I2510 Atherosclerotic heart disease of native coronary artery without angina pectoris: Secondary | ICD-10-CM | POA: Diagnosis not present

## 2017-01-28 DIAGNOSIS — E1122 Type 2 diabetes mellitus with diabetic chronic kidney disease: Secondary | ICD-10-CM | POA: Diagnosis not present

## 2017-01-28 DIAGNOSIS — D649 Anemia, unspecified: Secondary | ICD-10-CM | POA: Diagnosis not present

## 2017-01-28 DIAGNOSIS — N189 Chronic kidney disease, unspecified: Secondary | ICD-10-CM | POA: Diagnosis not present

## 2017-01-28 DIAGNOSIS — M545 Low back pain: Secondary | ICD-10-CM | POA: Diagnosis not present

## 2017-01-28 DIAGNOSIS — I69398 Other sequelae of cerebral infarction: Secondary | ICD-10-CM | POA: Diagnosis not present

## 2017-01-28 DIAGNOSIS — E785 Hyperlipidemia, unspecified: Secondary | ICD-10-CM | POA: Diagnosis not present

## 2017-01-31 DIAGNOSIS — I129 Hypertensive chronic kidney disease with stage 1 through stage 4 chronic kidney disease, or unspecified chronic kidney disease: Secondary | ICD-10-CM | POA: Diagnosis not present

## 2017-01-31 DIAGNOSIS — E1122 Type 2 diabetes mellitus with diabetic chronic kidney disease: Secondary | ICD-10-CM | POA: Diagnosis not present

## 2017-01-31 DIAGNOSIS — E43 Unspecified severe protein-calorie malnutrition: Secondary | ICD-10-CM | POA: Diagnosis not present

## 2017-01-31 DIAGNOSIS — E785 Hyperlipidemia, unspecified: Secondary | ICD-10-CM | POA: Diagnosis not present

## 2017-01-31 DIAGNOSIS — M545 Low back pain: Secondary | ICD-10-CM | POA: Diagnosis not present

## 2017-01-31 DIAGNOSIS — I69398 Other sequelae of cerebral infarction: Secondary | ICD-10-CM | POA: Diagnosis not present

## 2017-01-31 DIAGNOSIS — D649 Anemia, unspecified: Secondary | ICD-10-CM | POA: Diagnosis not present

## 2017-01-31 DIAGNOSIS — I2510 Atherosclerotic heart disease of native coronary artery without angina pectoris: Secondary | ICD-10-CM | POA: Diagnosis not present

## 2017-01-31 DIAGNOSIS — N189 Chronic kidney disease, unspecified: Secondary | ICD-10-CM | POA: Diagnosis not present

## 2017-01-31 DIAGNOSIS — M6281 Muscle weakness (generalized): Secondary | ICD-10-CM | POA: Diagnosis not present

## 2017-02-02 DIAGNOSIS — M6281 Muscle weakness (generalized): Secondary | ICD-10-CM | POA: Diagnosis not present

## 2017-02-02 DIAGNOSIS — E785 Hyperlipidemia, unspecified: Secondary | ICD-10-CM | POA: Diagnosis not present

## 2017-02-02 DIAGNOSIS — N189 Chronic kidney disease, unspecified: Secondary | ICD-10-CM | POA: Diagnosis not present

## 2017-02-02 DIAGNOSIS — E43 Unspecified severe protein-calorie malnutrition: Secondary | ICD-10-CM | POA: Diagnosis not present

## 2017-02-02 DIAGNOSIS — E1122 Type 2 diabetes mellitus with diabetic chronic kidney disease: Secondary | ICD-10-CM | POA: Diagnosis not present

## 2017-02-02 DIAGNOSIS — D649 Anemia, unspecified: Secondary | ICD-10-CM | POA: Diagnosis not present

## 2017-02-02 DIAGNOSIS — I2510 Atherosclerotic heart disease of native coronary artery without angina pectoris: Secondary | ICD-10-CM | POA: Diagnosis not present

## 2017-02-02 DIAGNOSIS — I129 Hypertensive chronic kidney disease with stage 1 through stage 4 chronic kidney disease, or unspecified chronic kidney disease: Secondary | ICD-10-CM | POA: Diagnosis not present

## 2017-02-02 DIAGNOSIS — I69398 Other sequelae of cerebral infarction: Secondary | ICD-10-CM | POA: Diagnosis not present

## 2017-02-02 DIAGNOSIS — M545 Low back pain: Secondary | ICD-10-CM | POA: Diagnosis not present

## 2017-02-06 DIAGNOSIS — E43 Unspecified severe protein-calorie malnutrition: Secondary | ICD-10-CM | POA: Diagnosis not present

## 2017-02-06 DIAGNOSIS — E1122 Type 2 diabetes mellitus with diabetic chronic kidney disease: Secondary | ICD-10-CM | POA: Diagnosis not present

## 2017-02-06 DIAGNOSIS — N189 Chronic kidney disease, unspecified: Secondary | ICD-10-CM | POA: Diagnosis not present

## 2017-02-06 DIAGNOSIS — I129 Hypertensive chronic kidney disease with stage 1 through stage 4 chronic kidney disease, or unspecified chronic kidney disease: Secondary | ICD-10-CM | POA: Diagnosis not present

## 2017-02-06 DIAGNOSIS — D649 Anemia, unspecified: Secondary | ICD-10-CM | POA: Diagnosis not present

## 2017-02-06 DIAGNOSIS — I69398 Other sequelae of cerebral infarction: Secondary | ICD-10-CM | POA: Diagnosis not present

## 2017-02-06 DIAGNOSIS — M545 Low back pain: Secondary | ICD-10-CM | POA: Diagnosis not present

## 2017-02-06 DIAGNOSIS — I2510 Atherosclerotic heart disease of native coronary artery without angina pectoris: Secondary | ICD-10-CM | POA: Diagnosis not present

## 2017-02-06 DIAGNOSIS — M6281 Muscle weakness (generalized): Secondary | ICD-10-CM | POA: Diagnosis not present

## 2017-02-06 DIAGNOSIS — E785 Hyperlipidemia, unspecified: Secondary | ICD-10-CM | POA: Diagnosis not present

## 2017-02-07 ENCOUNTER — Telehealth: Payer: Self-pay

## 2017-02-07 NOTE — Telephone Encounter (Signed)
I called pt to r/s her appt on 02/07/2017 because Dr. Brett Fairy will be out of the office during that time. No answer, left a message asking her to call me back.

## 2017-02-08 DIAGNOSIS — E785 Hyperlipidemia, unspecified: Secondary | ICD-10-CM | POA: Diagnosis not present

## 2017-02-08 DIAGNOSIS — E43 Unspecified severe protein-calorie malnutrition: Secondary | ICD-10-CM | POA: Diagnosis not present

## 2017-02-08 DIAGNOSIS — E1122 Type 2 diabetes mellitus with diabetic chronic kidney disease: Secondary | ICD-10-CM | POA: Diagnosis not present

## 2017-02-08 DIAGNOSIS — I129 Hypertensive chronic kidney disease with stage 1 through stage 4 chronic kidney disease, or unspecified chronic kidney disease: Secondary | ICD-10-CM | POA: Diagnosis not present

## 2017-02-08 DIAGNOSIS — D649 Anemia, unspecified: Secondary | ICD-10-CM | POA: Diagnosis not present

## 2017-02-08 DIAGNOSIS — M6281 Muscle weakness (generalized): Secondary | ICD-10-CM | POA: Diagnosis not present

## 2017-02-08 DIAGNOSIS — N189 Chronic kidney disease, unspecified: Secondary | ICD-10-CM | POA: Diagnosis not present

## 2017-02-08 DIAGNOSIS — I2510 Atherosclerotic heart disease of native coronary artery without angina pectoris: Secondary | ICD-10-CM | POA: Diagnosis not present

## 2017-02-08 DIAGNOSIS — I69398 Other sequelae of cerebral infarction: Secondary | ICD-10-CM | POA: Diagnosis not present

## 2017-02-08 DIAGNOSIS — M545 Low back pain: Secondary | ICD-10-CM | POA: Diagnosis not present

## 2017-02-09 ENCOUNTER — Encounter (INDEPENDENT_AMBULATORY_CARE_PROVIDER_SITE_OTHER): Payer: Self-pay

## 2017-02-09 ENCOUNTER — Ambulatory Visit (INDEPENDENT_AMBULATORY_CARE_PROVIDER_SITE_OTHER): Payer: Medicare HMO | Admitting: Neurology

## 2017-02-09 ENCOUNTER — Encounter: Payer: Self-pay | Admitting: Neurology

## 2017-02-09 VITALS — BP 115/66 | HR 65 | Resp 20 | Ht 65.0 in | Wt 137.0 lb

## 2017-02-09 DIAGNOSIS — Z951 Presence of aortocoronary bypass graft: Secondary | ICD-10-CM | POA: Insufficient documentation

## 2017-02-09 DIAGNOSIS — Z9989 Dependence on other enabling machines and devices: Secondary | ICD-10-CM | POA: Diagnosis not present

## 2017-02-09 DIAGNOSIS — I69328 Other speech and language deficits following cerebral infarction: Secondary | ICD-10-CM | POA: Diagnosis not present

## 2017-02-09 DIAGNOSIS — I5043 Acute on chronic combined systolic (congestive) and diastolic (congestive) heart failure: Secondary | ICD-10-CM | POA: Diagnosis not present

## 2017-02-09 DIAGNOSIS — G4733 Obstructive sleep apnea (adult) (pediatric): Secondary | ICD-10-CM

## 2017-02-09 DIAGNOSIS — I25118 Atherosclerotic heart disease of native coronary artery with other forms of angina pectoris: Secondary | ICD-10-CM | POA: Insufficient documentation

## 2017-02-09 DIAGNOSIS — I2510 Atherosclerotic heart disease of native coronary artery without angina pectoris: Secondary | ICD-10-CM

## 2017-02-09 NOTE — Progress Notes (Signed)
SLEEP MEDICINE CLINIC   Provider:  Larey Seat, M D  Referring Provider: Antony Blackbird, MD Primary Care Physician:  Daneen Schick, PA-C  Chief Complaint  Patient presents with  . New Patient (Initial Visit)    used to use cpap but can't tolerate it any more    HPI:  Nicole Lloyd is a 71 y.o. female , seen here as a referral  from her cardiologist , Dr.Ganji, for evaluation and procurement a new CPAP machine. Chief complaint according to patient : " I need a new sleep machine "      Mrs.  Lloyd is a 71 year old right-handed African-American female patient with an extensive past medical history. She was diagnosed with coronary artery disease in her 19s, underwent open heart surgery with bypass graft in March 2000 at [facility] after acute MI , follow-up catheterization by Dr. Rollene Fare here in [place] revealed patent vessels and an ejection fraction greater than 55%, Lexiscan Myoview stress test from 05/09/2016: EKG in sinus rhythm, she does report sometimes dyspnea. A new SPECT imaging documented normal myocardial thickness but her left ventricular ejection fraction recently was calculated at only 35%.  She also has GERD, typical and atypical chest pain, essential hypertension and cerebral atherosclerosis. 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  She is a controlled type II diabetic and has not used insulin. She suffered an acute myocardial infarction in the year 2000 .Suffered in  August 2016 stroke for which she had been followed by [facility] late Dr. Thana Ates. She suffered from the flu, lost significant weight and felt weak and still not fully restored. She also called her pneumonia, she reports.  She has followed Dr. Christen Butter and was seen by him the last time on February 28. She told him that she is using CPAP for the treatment of obstructive sleep apnea but his CPAP is broken, the humidifier does not work, the heating plate does not work, and  she feels that she no longer gets any benefit from [place]. Her machine is also at least 73 years old.   Sleep habits are as follows: Mrs. Nicole Lloyd reports that she is often in bed rather early between 8 and 9 PM and as long as her CPAP machine worked for her she felt that she got sound and restful sleep.  She keeps her bedroom cool, quiet and dark. She prefers to sleep on her side, with 3-4 pillows, prefers the head  elevated  the upper chest elevated. This has changed over the last 2 month, she now can no longer initiate sleep easily and she does not stay asleep. She wakes up at 10 PM, at midnight, again between 2 and 3 AM . There is no external trigger identified, no urge to urinate, no pain, discomfort, palpitations or diaphoresis nor nausea or headaches , she just wakes up -each time she wakes up and has great difficulties to go back to sleep. She estimates that some nights she will only get 4 hours of sleep or less. She always wakes spontaneously up at 6 AM but sometimes she doesn't have to rise and will just rest in bed for another hour or 2. She does not wake with headaches, nausea, palpitations or diaphoresis either. But she does know longer feel rested and refreshed. As the day goes on she feels tired again and after lunch will usually take a nap, this has now been a daily habit. The naps last 2 hours or  longer. She is widowed, lives with her daughter and sleeps alone.   She reports that she had recently a life like dream or hallucination during which her deceased husband appeared. She felt as if she could touch him,  he was so real . He appeared in her doorway , moved in front of her but he did not talk to her -a strictly visual hallucination- she saw him going towards the bed and  sleeping next to her in the bed.   Sleep medical history and family sleep history: no living sibling, oldest son has OSA and was on CPAP. He died at the gym- at  Age 55 .   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 Non smoker , non drinker. Caffeine use; soda and coffee each morning, 2- 3 a day.   Review of Systems: Out of a complete 14 system review, the patient complains of only the following symptoms, and all other reviewed systems are negative. Insomnia, gasping for air, chest pain. Weight loss, unintended, generalized weakness, myalgia, fatigue. Recently suffered from pneumonia and influenza. Geriatric depression score endorsed at 4 out of 15 points, Epworth sleepiness score at 11 out of 24 points, fatigue severity score 39 points  Social History   Social History  . Marital status: Widowed    Spouse name: N/A  . Number of children: N/A  . Years of education: N/A   Occupational History  . Not on file.   Social History Main Topics  . Smoking status: Never Smoker  . Smokeless tobacco: Never Used  . Alcohol use No  . Drug use: No  . Sexual activity: Not on file   Other Topics Concern  . Not on file   Social History Narrative  . No narrative on file    Family History  Problem Relation Age of Onset  . Arthritis Mother   . Breast cancer Mother   . CVA Mother   . Depression Mother   . Diabetes Mother   . Hypertension Mother   . Seizures Mother   . Alcohol abuse Brother   . Cervical cancer Daughter     Past Medical History:  Diagnosis Date  . Arthritis   . Cholelithiases   . Coronary artery disease   . CVA (cerebral infarction)   . Diabetes mellitus without complication ([place])   . Gall stones   . GERD (gastroesophageal reflux disease)   . Hypertension   . Myocardial infarction   . Schizophrenia ([place])   . Stroke [facility]) 06/1015   right side weakness, some speech impairment at times    Past Surgical History:  Procedure Laterality Date  . CHOLECYSTECTOMY N/A 09/06/2016   Procedure: LAPAROSCOPIC CHOLECYSTECTOMY WITH INTRAOPERATIVE CHOLANGIOGRAM;  Surgeon:  Donnie Mesa, MD;  Location: ;  Service: General;  Laterality: N/A;  . CORONARY ARTERY BYPASS GRAFT    . oopherectomy Bilateral   . TOTAL ABDOMINAL HYSTERECTOMY    . VENTRAL HERNIA REPAIR      Current Outpatient Prescriptions  Medication Sig Dispense Refill  . alendronate (FOSAMAX) 70 MG tablet Take 70 mg by mouth once a week.     . ALPRAZolam (XANAX) 0.25 MG tablet Take 0.25 mg by mouth at bedtime.    Marland Kitchen aspirin 81 MG tablet Take 81 mg by mouth daily.    . clotrimazole-betamethasone (LOTRISONE) cream Apply 1 application topically daily as needed (rash).     Marland Kitchen  dipyridamole-aspirin (AGGRENOX) 200-25 MG 12hr capsule Take 1 capsule by mouth 2 (two) times daily.    . metFORMIN (GLUCOPHAGE) 1000 MG tablet Take 1,000 mg by mouth 2 (two) times daily with a meal.    . metoprolol (LOPRESSOR) 50 MG tablet Take 25 mg by mouth 2 (two) times daily.    . pantoprazole (PROTONIX) 40 MG tablet Take 1 tablet (40 mg total) by mouth daily. 30 tablet 0  . RaNITidine HCl (EQ RANITIDINE PO) Take by mouth.    . Rosuvastatin Calcium (CRESTOR PO) Take by mouth.     No current facility-administered medications for this visit.     Allergies as of 02/09/2017 - Review Complete 02/09/2017  Allergen Reaction Noted  . No known allergies  09/05/2016    Vitals: BP 115/66   Pulse 65   Resp 20   Ht 5\' 5"  (1.651 m)   Wt 137 lb (62.1 kg)   BMI 22.80 kg/m  Last Weight:  Wt Readings from Last 1 Encounters:  02/09/17 137 lb (62.1 kg)   ZOX:WRUE mass index is 22.8 kg/m.     Last Height:   Ht Readings from Last 1 Encounters:  02/09/17 5\' 5"  (1.651 m)    Physical exam:  General: The patient is awake, alert and appears not in acute distress. The patient is well groomed. Head: Normocephalic, atraumatic. Neck is supple. Mallampati 4  neck circumference:13.5  Nasal airflow patent ,  Retrognathia is seen, upper set of dentures.  Cardiovascular:  Regular rate and rhythm, without  murmurs or carotid bruit, and  without distended neck veins. Respiratory: Lungs are clear to auscultation. Skin:  Without evidence of edema, or rash Trunk: BMI is 23. The patient's posture is stooped.   Neurologic exam : The patient is awake and alert, oriented to place and time.      Attention span & concentration ability appears normal.  Speech is non-fluent,  with dysphonia / aphasia.  Mood and affect are appropriate.  Cranial nerves: Pupils are equal and briskly reactive to light. Funduscopic exam without  evidence of pallor or edema. Arcus senilis.  Status post cataract . Extraocular movements  in vertical and horizontal planes intact and without nystagmus.  Visual fields by finger perimetry are intact. Hearing to finger rub intact.  Facial sensation intact to fine touch.  Facial motor strength is symmetric and tongue and uvula move midline. Shoulder shrug was symmetrical.   Motor exam:  Symmetric  muscle bulk and symmetric strength in all extremities. She provides symmetric grip strength.  Sensory:  Fine touch, pinprick and vibration were intact - Proprioception tested in the upper extremities was normal.  Coordination: Rapid alternating movements in the fingers/hands was slowed - Finger-to-nose maneuver without evidence of ataxia, dysmetria or tremor.  Gait and station: Patient walks without assistive device and is able unassisted to climb up to the exam table. She does drift to the left side, is supposed to use a cane. Her stroke seems to have affected her left body half .    Assessment:  After physical and neurologic examination, review of laboratory studies,  Personal review of imaging studies, reports of other /same  Imaging studies ,  Results of polysomnography/ neurophysiology testing and pre-existing records as far as provided in visit., my assessment is   1) Mrs. Gladwin reports that she was diagnosed with sleep apnea approximately in the year 2000 soon after her heart disease became evident. Her  current machine may be as old as 16 years or  as young as 53.  2) CAD she does have a history of coronary artery disease, a history of recent stroke August 2016 she status post bypass surgery. Hypertension, GERD and diabetes medicines are present this patient would definitely qualify for a new machine but I will need to establish a new baseline. She has a new EF 34 % .   3) CVA the patient does describe a drift to the left when walking, her gait disorder is related to her stroke, speech and memory have suffered with a stroke as well.   The patient was advised of the nature of the diagnosed sleep disorder , the treatment options and risks for general a health and wellness arising from not treating the condition.  I spent more than 45 minutes of face to face time with the patient.  I was unable to review the original sleep study which probably is over a decade old. I did receive laboratory values and cardiac testing results from Dr. Darnell Level. I cannot obtain a data download from her machine this age.   Greater than 50% of time was spent in counseling and coordination of care. We have discussed the diagnosis and differential and I answered the patient's questions.    Plan:  Treatment plan and additional workup :  SPLIT night study and RV after 60-90 days. Refit for mask of choice.   Asencion Partridge Janele Lague MD  02/09/2017   CC: Antony Blackbird, Md [address] [place], [place] [address]

## 2017-02-13 DIAGNOSIS — I2510 Atherosclerotic heart disease of native coronary artery without angina pectoris: Secondary | ICD-10-CM | POA: Diagnosis not present

## 2017-02-13 DIAGNOSIS — M4800 Spinal stenosis, site unspecified: Secondary | ICD-10-CM | POA: Diagnosis not present

## 2017-02-13 DIAGNOSIS — Z7982 Long term (current) use of aspirin: Secondary | ICD-10-CM | POA: Diagnosis not present

## 2017-02-13 DIAGNOSIS — Z8673 Personal history of transient ischemic attack (TIA), and cerebral infarction without residual deficits: Secondary | ICD-10-CM | POA: Diagnosis not present

## 2017-02-13 DIAGNOSIS — Z7902 Long term (current) use of antithrombotics/antiplatelets: Secondary | ICD-10-CM | POA: Diagnosis not present

## 2017-02-13 DIAGNOSIS — Z87891 Personal history of nicotine dependence: Secondary | ICD-10-CM | POA: Diagnosis not present

## 2017-02-13 DIAGNOSIS — E785 Hyperlipidemia, unspecified: Secondary | ICD-10-CM | POA: Diagnosis not present

## 2017-02-13 DIAGNOSIS — E119 Type 2 diabetes mellitus without complications: Secondary | ICD-10-CM | POA: Diagnosis not present

## 2017-02-13 DIAGNOSIS — I6503 Occlusion and stenosis of bilateral vertebral arteries: Secondary | ICD-10-CM | POA: Diagnosis not present

## 2017-02-13 DIAGNOSIS — I10 Essential (primary) hypertension: Secondary | ICD-10-CM | POA: Diagnosis not present

## 2017-02-13 DIAGNOSIS — Z888 Allergy status to other drugs, medicaments and biological substances status: Secondary | ICD-10-CM | POA: Diagnosis not present

## 2017-03-10 ENCOUNTER — Ambulatory Visit (INDEPENDENT_AMBULATORY_CARE_PROVIDER_SITE_OTHER): Payer: Medicare HMO | Admitting: Neurology

## 2017-03-10 DIAGNOSIS — G4733 Obstructive sleep apnea (adult) (pediatric): Secondary | ICD-10-CM

## 2017-03-10 DIAGNOSIS — I69328 Other speech and language deficits following cerebral infarction: Secondary | ICD-10-CM

## 2017-03-10 DIAGNOSIS — Z9989 Dependence on other enabling machines and devices: Secondary | ICD-10-CM

## 2017-03-10 DIAGNOSIS — Z951 Presence of aortocoronary bypass graft: Secondary | ICD-10-CM

## 2017-03-10 DIAGNOSIS — I5043 Acute on chronic combined systolic (congestive) and diastolic (congestive) heart failure: Secondary | ICD-10-CM

## 2017-03-10 DIAGNOSIS — I2510 Atherosclerotic heart disease of native coronary artery without angina pectoris: Secondary | ICD-10-CM

## 2017-03-20 NOTE — Procedures (Signed)
PATIENT'S NAME:  Nicole Lloyd, Nicole Lloyd DOB:      January 29, 1946      MR#:    [id number]     DATE OF RECORDING: 03/10/2017 REFERRING M.D.:  Sharyn Blitz, Dr Einar Gip,  MD Study Performed:   Baseline Polysomnogram HISTORY:  Mrs.  Orchard is a 71 year old right-handed African-American female patient with an extensive medical history.  She suffered a stroke with left hemiparesis. She was diagnosed with coronary artery disease in her 74s, underwent open heart surgery with bypass graft in March 2000 after acute MI , follow-up catheterization by Dr. Rollene Fare here in [place] revealed patent vessels and an ejection fraction greater than 55%.  Myoview stress test from 05/09/2016: EKG in sinus rhythm, she does report sometimes dyspnea. A new SPECT imaging documented normal myocardial thickness but her left ventricular ejection fraction recently was calculated at only 35%. Has OSA and currently owning a 73 year old CPAP. CPAP is broken, the humidifier and heating plate do not work, and she feels that she no longer gets any benefit from CPA-pressure.  The patient endorsed the Epworth Sleepiness Scale at 11 points.   The patient's weight 137 pounds with a height of 65 (inches), resulting in a BMI of 22.8 kg/m2. The patient's neck circumference measured 13.5 inches.  CURRENT MEDICATIONS: Fosamax, Xanax, Aspirin, Lotrisone, Aggrenox, Glucophage, Lopressor, Protonix, Ranitidine, Crestor   PROCEDURE:  This is a multichannel digital polysomnogram utilizing the Somnostar 11.2 system.  Electrodes and sensors were applied and monitored per AASM Specifications.   EEG, EOG, Chin and Limb EMG, were sampled at 200 Hz.  ECG, Snore and Nasal Pressure, Thermal Airflow, Respiratory Effort, CPAP Flow and Pressure, Oximetry was sampled at 50 Hz. Digital video and audio were recorded.      BASELINE STUDY Lights Out was at 21:14 and Lights On at 05:02.  Total recording time (TRT) was 468 minutes, with a total sleep time (TST) of  431 minutes.    The patient's sleep latency was 15.5 minutes.  REM latency was 264.5 minutes.  The sleep efficiency was 92.1 %.     SLEEP ARCHITECTURE: WASO (Wake after sleep onset) was 21.5 minutes.  There were 7.5 minutes in Stage N1, 326 minutes Stage N2, 35.5 minutes Stage N3 and 62 minutes in Stage REM.  The percentage of Stage N1 was 1.7%, Stage N2 was 75.6%, Stage N3 was 8.2% and Stage R (REM sleep) was 14.4%.   RESPIRATORY ANALYSIS:  There were a total of 25 respiratory events:  10 obstructive apneas, 0 central apneas and 0 mixed apneas with a total of 10 apneas and an apnea index (AI) of 1.4 /hour. There were 15 hypopneas with a hypopnea index of 2.1 /hour. The patient also had 1 respiratory event related arousals (RERAs).     The total APNEA/HYPOPNEA INDEX (AHI) was 3.5/hour and the total RESPIRATORY DISTURBANCE INDEX was 3.6 /hour.  4 events occurred in REM sleep and 22 events in NREM. The REM AHI was 3.9 /hour, versus a non-REM AHI of 3.4. The patient spent 219.5 minutes of total sleep time in the supine position and 212 minutes in non-supine. The supine AHI was 5.7 versus a non-supine AHI of 1.1.  OXYGEN SATURATION & C02:  The Wake baseline 02 saturation was 95%, with the lowest being 82%. Time spent below 89% saturation equaled 1 minute.   PERIODIC LIMB MOVEMENTS:  The patient had a total of 26 Periodic Limb Movements.  The Periodic Limb Movement (PLM) index was 3.6 and the  PLM Arousal index was 0.4/hour. The arousals were noted as: 21 were spontaneous, 3 were associated with PLMs and 12 were associated with respiratory events.  Audio and video analysis did not show any abnormal or unusual movements, behaviors, phonations or vocalizations.  The patient took bathroom breaks. Snoring was noted. EKG was in keeping with normal sinus rhythm (NSR). Post-study, the patient indicated  that her sleep was better than  usual.    IMPRESSION: No longer is Obstructive Sleep Apnea(OSA) present. No need for CPAP.    RECOMMENDATIONS:  1. Avoid sedative-hypnotics which may worsen sleep apnea, alcohol and tobacco (as applicable). 2. ENT examination and / or dental device as clinically indicated for snoring treatment in the absence of significant apnea.  3. Further information regarding OSA may be obtained from [employer] (www.sleepfoundation.org) or American Sleep Apnea Association (www.sleepapnea.org). 4. Consider dedicated sleep psychology referral if insomnia is of clinical concern.   5. A follow up appointment can be scheduled in the Sleep Clinic at [facility]. The referring provider will be notified of the results.      I certify that I have reviewed the entire raw data recording prior to the issuance of this report in accordance with the Standards of Accreditation of the American Academy of Sleep Medicine (AASM)    Larey Seat, MD              20 March 2017  Diplomat, Tax adviser of Psychiatry and Neurology  Diplomat, Tax adviser of Sleep Medicine Market researcher, [employer] [facility] at [employer]

## 2017-03-22 DIAGNOSIS — R627 Adult failure to thrive: Secondary | ICD-10-CM | POA: Diagnosis not present

## 2017-03-22 DIAGNOSIS — E119 Type 2 diabetes mellitus without complications: Secondary | ICD-10-CM | POA: Diagnosis not present

## 2017-03-22 DIAGNOSIS — I2510 Atherosclerotic heart disease of native coronary artery without angina pectoris: Secondary | ICD-10-CM | POA: Diagnosis not present

## 2017-03-22 DIAGNOSIS — Z6822 Body mass index (BMI) 22.0-22.9, adult: Secondary | ICD-10-CM | POA: Diagnosis not present

## 2017-03-22 DIAGNOSIS — Z951 Presence of aortocoronary bypass graft: Secondary | ICD-10-CM | POA: Diagnosis not present

## 2017-03-22 DIAGNOSIS — Z8673 Personal history of transient ischemic attack (TIA), and cerebral infarction without residual deficits: Secondary | ICD-10-CM | POA: Diagnosis not present

## 2017-03-22 DIAGNOSIS — Z87891 Personal history of nicotine dependence: Secondary | ICD-10-CM | POA: Diagnosis not present

## 2017-03-22 DIAGNOSIS — R531 Weakness: Secondary | ICD-10-CM | POA: Diagnosis not present

## 2017-03-22 DIAGNOSIS — Z7984 Long term (current) use of oral hypoglycemic drugs: Secondary | ICD-10-CM | POA: Diagnosis not present

## 2017-03-22 DIAGNOSIS — D638 Anemia in other chronic diseases classified elsewhere: Secondary | ICD-10-CM | POA: Diagnosis not present

## 2017-03-22 DIAGNOSIS — D6489 Other specified anemias: Secondary | ICD-10-CM | POA: Diagnosis not present

## 2017-03-23 ENCOUNTER — Telehealth: Payer: Self-pay

## 2017-03-23 DIAGNOSIS — I10 Essential (primary) hypertension: Secondary | ICD-10-CM | POA: Diagnosis not present

## 2017-03-23 DIAGNOSIS — Z79899 Other long term (current) drug therapy: Secondary | ICD-10-CM | POA: Diagnosis not present

## 2017-03-23 DIAGNOSIS — Z8673 Personal history of transient ischemic attack (TIA), and cerebral infarction without residual deficits: Secondary | ICD-10-CM | POA: Diagnosis not present

## 2017-03-23 DIAGNOSIS — I6523 Occlusion and stenosis of bilateral carotid arteries: Secondary | ICD-10-CM | POA: Diagnosis not present

## 2017-03-23 DIAGNOSIS — I2510 Atherosclerotic heart disease of native coronary artery without angina pectoris: Secondary | ICD-10-CM | POA: Diagnosis not present

## 2017-03-23 DIAGNOSIS — Z7982 Long term (current) use of aspirin: Secondary | ICD-10-CM | POA: Diagnosis not present

## 2017-03-23 DIAGNOSIS — Z87891 Personal history of nicotine dependence: Secondary | ICD-10-CM | POA: Diagnosis not present

## 2017-03-23 DIAGNOSIS — E785 Hyperlipidemia, unspecified: Secondary | ICD-10-CM | POA: Diagnosis not present

## 2017-03-23 DIAGNOSIS — E119 Type 2 diabetes mellitus without complications: Secondary | ICD-10-CM | POA: Diagnosis not present

## 2017-03-23 NOTE — Telephone Encounter (Signed)
I tried calling pt 3 times. All three times, someone picked up the phone but did not say anything, even after I said hello. I will try again later.

## 2017-03-23 NOTE — Telephone Encounter (Signed)
-----   Message from Larey Seat, MD sent at 03/20/2017 11:59 AM EDT ----- IMPRESSION: No longer is Obstructive Sleep Apnea(OSA) present. No  need for CPAP.   RECOMMENDATIONS:  1. Avoid sedative-hypnotics which may worsen sleep apnea, alcohol  and tobacco (as applicable). 2. ENT examination and / or dental device as clinically indicated  for snoring treatment in the absence of significant apnea.  3. Further information regarding OSA may be obtained from  [employer] (www.sleepfoundation.org) or American  Sleep Apnea Association (www.sleepapnea.org). 4. Consider dedicated sleep psychology referral if insomnia is of  clinical concern.  5. A follow up appointment can be scheduled in the Sleep Clinic  at [facility]. The referring provider will be  notified of the results.

## 2017-04-05 NOTE — Telephone Encounter (Signed)
I called Nicole Lloyd. I advised her that her sleep study did not show any sleep apnea present and she does not need her cpap any longer. I advised Nicole Lloyd to avoid sedative-hypnotics which may worsen sleep apnea, alcohol and tobacco. I advised her that she may consider an ENT procedure or a dental device for snoring, or a sleep psychologist for insomnia. Nicole Lloyd says "I have had enough with doctors." Nicole Lloyd says that she could not tolerate cpap anyways and wants our sleep lab to donate hers. Nicole Lloyd declined a follow up with Dr. Brett Fairy. Nicole Lloyd verbalized understanding of results. Nicole Lloyd had no questions at this time but was encouraged to call back if questions arise.

## 2017-05-13 DIAGNOSIS — R1084 Generalized abdominal pain: Secondary | ICD-10-CM | POA: Diagnosis not present

## 2017-05-13 DIAGNOSIS — E119 Type 2 diabetes mellitus without complications: Secondary | ICD-10-CM | POA: Diagnosis not present

## 2017-05-13 DIAGNOSIS — K529 Noninfective gastroenteritis and colitis, unspecified: Secondary | ICD-10-CM | POA: Diagnosis not present

## 2017-05-13 DIAGNOSIS — R110 Nausea: Secondary | ICD-10-CM | POA: Diagnosis not present

## 2017-05-16 DIAGNOSIS — K529 Noninfective gastroenteritis and colitis, unspecified: Secondary | ICD-10-CM | POA: Diagnosis not present

## 2017-05-16 DIAGNOSIS — E119 Type 2 diabetes mellitus without complications: Secondary | ICD-10-CM | POA: Diagnosis not present

## 2017-05-16 DIAGNOSIS — R1084 Generalized abdominal pain: Secondary | ICD-10-CM | POA: Diagnosis not present

## 2017-06-13 DIAGNOSIS — E782 Mixed hyperlipidemia: Secondary | ICD-10-CM | POA: Diagnosis not present

## 2017-06-13 DIAGNOSIS — I10 Essential (primary) hypertension: Secondary | ICD-10-CM | POA: Diagnosis not present

## 2017-06-13 DIAGNOSIS — E1100 Type 2 diabetes mellitus with hyperosmolarity without nonketotic hyperglycemic-hyperosmolar coma (NKHHC): Secondary | ICD-10-CM | POA: Diagnosis not present

## 2017-06-13 DIAGNOSIS — M130 Polyarthritis, unspecified: Secondary | ICD-10-CM | POA: Diagnosis not present

## 2017-06-13 DIAGNOSIS — E0842 Diabetes mellitus due to underlying condition with diabetic polyneuropathy: Secondary | ICD-10-CM | POA: Diagnosis not present

## 2017-06-27 DIAGNOSIS — R1030 Lower abdominal pain, unspecified: Secondary | ICD-10-CM | POA: Diagnosis not present

## 2017-06-27 DIAGNOSIS — I10 Essential (primary) hypertension: Secondary | ICD-10-CM | POA: Diagnosis not present

## 2017-06-27 DIAGNOSIS — E782 Mixed hyperlipidemia: Secondary | ICD-10-CM | POA: Diagnosis not present

## 2017-07-28 DIAGNOSIS — D649 Anemia, unspecified: Secondary | ICD-10-CM | POA: Diagnosis not present

## 2017-07-28 DIAGNOSIS — E1100 Type 2 diabetes mellitus with hyperosmolarity without nonketotic hyperglycemic-hyperosmolar coma (NKHHC): Secondary | ICD-10-CM | POA: Diagnosis not present

## 2017-07-28 DIAGNOSIS — I10 Essential (primary) hypertension: Secondary | ICD-10-CM | POA: Diagnosis not present

## 2017-07-28 DIAGNOSIS — E782 Mixed hyperlipidemia: Secondary | ICD-10-CM | POA: Diagnosis not present

## 2017-07-28 DIAGNOSIS — R1030 Lower abdominal pain, unspecified: Secondary | ICD-10-CM | POA: Diagnosis not present

## 2017-08-24 DIAGNOSIS — R69 Illness, unspecified: Secondary | ICD-10-CM | POA: Diagnosis not present

## 2017-09-13 DIAGNOSIS — E782 Mixed hyperlipidemia: Secondary | ICD-10-CM | POA: Diagnosis not present

## 2017-09-13 DIAGNOSIS — E1100 Type 2 diabetes mellitus with hyperosmolarity without nonketotic hyperglycemic-hyperosmolar coma (NKHHC): Secondary | ICD-10-CM | POA: Diagnosis not present

## 2017-09-13 DIAGNOSIS — M130 Polyarthritis, unspecified: Secondary | ICD-10-CM | POA: Diagnosis not present

## 2017-09-13 DIAGNOSIS — R1030 Lower abdominal pain, unspecified: Secondary | ICD-10-CM | POA: Diagnosis not present

## 2017-09-13 DIAGNOSIS — I10 Essential (primary) hypertension: Secondary | ICD-10-CM | POA: Diagnosis not present

## 2017-10-23 DIAGNOSIS — E7800 Pure hypercholesterolemia, unspecified: Secondary | ICD-10-CM | POA: Diagnosis not present

## 2017-10-23 DIAGNOSIS — I10 Essential (primary) hypertension: Secondary | ICD-10-CM | POA: Diagnosis not present

## 2017-10-23 DIAGNOSIS — R079 Chest pain, unspecified: Secondary | ICD-10-CM | POA: Diagnosis not present

## 2017-10-23 DIAGNOSIS — I2510 Atherosclerotic heart disease of native coronary artery without angina pectoris: Secondary | ICD-10-CM | POA: Diagnosis not present

## 2017-10-27 DIAGNOSIS — I2510 Atherosclerotic heart disease of native coronary artery without angina pectoris: Secondary | ICD-10-CM | POA: Diagnosis not present

## 2017-10-27 DIAGNOSIS — R0789 Other chest pain: Secondary | ICD-10-CM | POA: Diagnosis not present

## 2017-11-24 DIAGNOSIS — E782 Mixed hyperlipidemia: Secondary | ICD-10-CM | POA: Diagnosis not present

## 2017-11-24 DIAGNOSIS — E1100 Type 2 diabetes mellitus with hyperosmolarity without nonketotic hyperglycemic-hyperosmolar coma (NKHHC): Secondary | ICD-10-CM | POA: Diagnosis not present

## 2017-11-24 DIAGNOSIS — E0842 Diabetes mellitus due to underlying condition with diabetic polyneuropathy: Secondary | ICD-10-CM | POA: Diagnosis not present

## 2017-11-24 DIAGNOSIS — I10 Essential (primary) hypertension: Secondary | ICD-10-CM | POA: Diagnosis not present

## 2017-11-24 DIAGNOSIS — D649 Anemia, unspecified: Secondary | ICD-10-CM | POA: Diagnosis not present

## 2017-11-24 DIAGNOSIS — M130 Polyarthritis, unspecified: Secondary | ICD-10-CM | POA: Diagnosis not present

## 2017-12-08 DIAGNOSIS — D649 Anemia, unspecified: Secondary | ICD-10-CM | POA: Diagnosis not present

## 2017-12-08 DIAGNOSIS — Z0000 Encounter for general adult medical examination without abnormal findings: Secondary | ICD-10-CM | POA: Diagnosis not present

## 2017-12-08 DIAGNOSIS — E1100 Type 2 diabetes mellitus with hyperosmolarity without nonketotic hyperglycemic-hyperosmolar coma (NKHHC): Secondary | ICD-10-CM | POA: Diagnosis not present

## 2017-12-08 DIAGNOSIS — I10 Essential (primary) hypertension: Secondary | ICD-10-CM | POA: Diagnosis not present

## 2017-12-20 DIAGNOSIS — Z1212 Encounter for screening for malignant neoplasm of rectum: Secondary | ICD-10-CM | POA: Diagnosis not present

## 2017-12-20 DIAGNOSIS — Z1211 Encounter for screening for malignant neoplasm of colon: Secondary | ICD-10-CM | POA: Diagnosis not present

## 2017-12-26 DIAGNOSIS — E7800 Pure hypercholesterolemia, unspecified: Secondary | ICD-10-CM | POA: Diagnosis not present

## 2017-12-26 DIAGNOSIS — R079 Chest pain, unspecified: Secondary | ICD-10-CM | POA: Diagnosis not present

## 2017-12-26 DIAGNOSIS — I10 Essential (primary) hypertension: Secondary | ICD-10-CM | POA: Diagnosis not present

## 2017-12-26 DIAGNOSIS — I2510 Atherosclerotic heart disease of native coronary artery without angina pectoris: Secondary | ICD-10-CM | POA: Diagnosis not present

## 2017-12-28 DIAGNOSIS — Z0100 Encounter for examination of eyes and vision without abnormal findings: Secondary | ICD-10-CM | POA: Diagnosis not present

## 2017-12-28 DIAGNOSIS — E119 Type 2 diabetes mellitus without complications: Secondary | ICD-10-CM | POA: Diagnosis not present

## 2017-12-28 DIAGNOSIS — Z961 Presence of intraocular lens: Secondary | ICD-10-CM | POA: Diagnosis not present

## 2017-12-28 DIAGNOSIS — H5203 Hypermetropia, bilateral: Secondary | ICD-10-CM | POA: Diagnosis not present

## 2017-12-28 DIAGNOSIS — H524 Presbyopia: Secondary | ICD-10-CM | POA: Diagnosis not present

## 2018-02-12 ENCOUNTER — Other Ambulatory Visit: Payer: Self-pay | Admitting: Family Medicine

## 2018-02-12 ENCOUNTER — Ambulatory Visit
Admission: RE | Admit: 2018-02-12 | Discharge: 2018-02-12 | Disposition: A | Discharge status: Home / Self Care | Payer: Medicare HMO | Source: Ambulatory Visit | Attending: Family Medicine | Admitting: Family Medicine

## 2018-02-12 DIAGNOSIS — M791 Myalgia, unspecified site: Secondary | ICD-10-CM

## 2018-02-12 DIAGNOSIS — M545 Low back pain: Secondary | ICD-10-CM | POA: Diagnosis not present

## 2018-02-12 DIAGNOSIS — M47816 Spondylosis without myelopathy or radiculopathy, lumbar region: Secondary | ICD-10-CM | POA: Diagnosis not present

## 2018-02-22 DIAGNOSIS — I10 Essential (primary) hypertension: Secondary | ICD-10-CM | POA: Diagnosis not present

## 2018-02-22 DIAGNOSIS — R69 Illness, unspecified: Secondary | ICD-10-CM | POA: Diagnosis not present

## 2018-03-08 DIAGNOSIS — Z87891 Personal history of nicotine dependence: Secondary | ICD-10-CM | POA: Diagnosis not present

## 2018-03-08 DIAGNOSIS — G453 Amaurosis fugax: Secondary | ICD-10-CM | POA: Diagnosis not present

## 2018-03-08 DIAGNOSIS — I119 Hypertensive heart disease without heart failure: Secondary | ICD-10-CM | POA: Diagnosis not present

## 2018-03-08 DIAGNOSIS — I10 Essential (primary) hypertension: Secondary | ICD-10-CM | POA: Diagnosis not present

## 2018-03-08 DIAGNOSIS — Z951 Presence of aortocoronary bypass graft: Secondary | ICD-10-CM | POA: Diagnosis not present

## 2018-03-08 DIAGNOSIS — I6389 Other cerebral infarction: Secondary | ICD-10-CM | POA: Diagnosis not present

## 2018-03-08 DIAGNOSIS — Z8673 Personal history of transient ischemic attack (TIA), and cerebral infarction without residual deficits: Secondary | ICD-10-CM | POA: Diagnosis not present

## 2018-04-11 DIAGNOSIS — G453 Amaurosis fugax: Secondary | ICD-10-CM | POA: Diagnosis not present

## 2018-04-25 DIAGNOSIS — D649 Anemia, unspecified: Secondary | ICD-10-CM | POA: Diagnosis not present

## 2018-04-25 DIAGNOSIS — M130 Polyarthritis, unspecified: Secondary | ICD-10-CM | POA: Diagnosis not present

## 2018-04-25 DIAGNOSIS — R69 Illness, unspecified: Secondary | ICD-10-CM | POA: Diagnosis not present

## 2018-04-25 DIAGNOSIS — E782 Mixed hyperlipidemia: Secondary | ICD-10-CM | POA: Diagnosis not present

## 2018-04-25 DIAGNOSIS — E118 Type 2 diabetes mellitus with unspecified complications: Secondary | ICD-10-CM | POA: Diagnosis not present

## 2018-04-25 DIAGNOSIS — E1100 Type 2 diabetes mellitus with hyperosmolarity without nonketotic hyperglycemic-hyperosmolar coma (NKHHC): Secondary | ICD-10-CM | POA: Diagnosis not present

## 2018-04-25 DIAGNOSIS — I43 Cardiomyopathy in diseases classified elsewhere: Secondary | ICD-10-CM | POA: Diagnosis not present

## 2018-04-25 DIAGNOSIS — I10 Essential (primary) hypertension: Secondary | ICD-10-CM | POA: Diagnosis not present

## 2018-07-26 DIAGNOSIS — F4320 Adjustment disorder, unspecified: Secondary | ICD-10-CM | POA: Diagnosis not present

## 2018-07-26 DIAGNOSIS — M130 Polyarthritis, unspecified: Secondary | ICD-10-CM | POA: Diagnosis not present

## 2018-07-26 DIAGNOSIS — E782 Mixed hyperlipidemia: Secondary | ICD-10-CM | POA: Diagnosis not present

## 2018-07-26 DIAGNOSIS — E1100 Type 2 diabetes mellitus with hyperosmolarity without nonketotic hyperglycemic-hyperosmolar coma (NKHHC): Secondary | ICD-10-CM | POA: Diagnosis not present

## 2018-07-26 DIAGNOSIS — I10 Essential (primary) hypertension: Secondary | ICD-10-CM | POA: Diagnosis not present

## 2018-07-26 DIAGNOSIS — Z6832 Body mass index (BMI) 32.0-32.9, adult: Secondary | ICD-10-CM | POA: Diagnosis not present

## 2018-08-08 DIAGNOSIS — M130 Polyarthritis, unspecified: Secondary | ICD-10-CM | POA: Diagnosis not present

## 2018-08-08 DIAGNOSIS — I10 Essential (primary) hypertension: Secondary | ICD-10-CM | POA: Diagnosis not present

## 2018-08-08 DIAGNOSIS — E1100 Type 2 diabetes mellitus with hyperosmolarity without nonketotic hyperglycemic-hyperosmolar coma (NKHHC): Secondary | ICD-10-CM | POA: Diagnosis not present

## 2018-08-08 DIAGNOSIS — Z6832 Body mass index (BMI) 32.0-32.9, adult: Secondary | ICD-10-CM | POA: Diagnosis not present

## 2018-08-27 DIAGNOSIS — E7800 Pure hypercholesterolemia, unspecified: Secondary | ICD-10-CM | POA: Diagnosis not present

## 2018-08-27 DIAGNOSIS — I10 Essential (primary) hypertension: Secondary | ICD-10-CM | POA: Diagnosis not present

## 2018-08-27 DIAGNOSIS — I2510 Atherosclerotic heart disease of native coronary artery without angina pectoris: Secondary | ICD-10-CM | POA: Diagnosis not present

## 2018-09-19 ENCOUNTER — Other Ambulatory Visit: Payer: Self-pay | Admitting: Family Medicine

## 2018-09-19 ENCOUNTER — Ambulatory Visit
Admission: RE | Admit: 2018-09-19 | Discharge: 2018-09-19 | Disposition: A | Discharge status: Home / Self Care | Payer: Medicare HMO | Source: Ambulatory Visit | Attending: Family Medicine | Admitting: Family Medicine

## 2018-09-19 DIAGNOSIS — M130 Polyarthritis, unspecified: Secondary | ICD-10-CM | POA: Diagnosis not present

## 2018-09-19 DIAGNOSIS — E782 Mixed hyperlipidemia: Secondary | ICD-10-CM | POA: Diagnosis not present

## 2018-09-19 DIAGNOSIS — Z1231 Encounter for screening mammogram for malignant neoplasm of breast: Secondary | ICD-10-CM

## 2018-09-19 DIAGNOSIS — Z0000 Encounter for general adult medical examination without abnormal findings: Secondary | ICD-10-CM | POA: Diagnosis not present

## 2018-09-19 DIAGNOSIS — I10 Essential (primary) hypertension: Secondary | ICD-10-CM | POA: Diagnosis not present

## 2018-10-09 DIAGNOSIS — L84 Corns and callosities: Secondary | ICD-10-CM | POA: Diagnosis not present

## 2018-10-09 DIAGNOSIS — M79676 Pain in unspecified toe(s): Secondary | ICD-10-CM | POA: Diagnosis not present

## 2018-10-09 DIAGNOSIS — E1142 Type 2 diabetes mellitus with diabetic polyneuropathy: Secondary | ICD-10-CM | POA: Diagnosis not present

## 2018-10-09 DIAGNOSIS — B351 Tinea unguium: Secondary | ICD-10-CM | POA: Diagnosis not present

## 2018-11-26 DIAGNOSIS — F4320 Adjustment disorder, unspecified: Secondary | ICD-10-CM | POA: Diagnosis not present

## 2018-11-26 DIAGNOSIS — I10 Essential (primary) hypertension: Secondary | ICD-10-CM | POA: Diagnosis not present

## 2018-11-26 DIAGNOSIS — F3289 Other specified depressive episodes: Secondary | ICD-10-CM | POA: Diagnosis not present

## 2018-11-26 DIAGNOSIS — M130 Polyarthritis, unspecified: Secondary | ICD-10-CM | POA: Diagnosis not present

## 2018-11-26 DIAGNOSIS — E782 Mixed hyperlipidemia: Secondary | ICD-10-CM | POA: Diagnosis not present

## 2018-11-26 DIAGNOSIS — Z6833 Body mass index (BMI) 33.0-33.9, adult: Secondary | ICD-10-CM | POA: Diagnosis not present

## 2018-12-14 DIAGNOSIS — Z6833 Body mass index (BMI) 33.0-33.9, adult: Secondary | ICD-10-CM | POA: Diagnosis not present

## 2018-12-14 DIAGNOSIS — E1100 Type 2 diabetes mellitus with hyperosmolarity without nonketotic hyperglycemic-hyperosmolar coma (NKHHC): Secondary | ICD-10-CM | POA: Diagnosis not present

## 2018-12-14 DIAGNOSIS — E782 Mixed hyperlipidemia: Secondary | ICD-10-CM | POA: Diagnosis not present

## 2018-12-14 DIAGNOSIS — F5101 Primary insomnia: Secondary | ICD-10-CM | POA: Diagnosis not present

## 2018-12-14 DIAGNOSIS — M549 Dorsalgia, unspecified: Secondary | ICD-10-CM | POA: Diagnosis not present

## 2018-12-18 DIAGNOSIS — L84 Corns and callosities: Secondary | ICD-10-CM | POA: Diagnosis not present

## 2018-12-18 DIAGNOSIS — B351 Tinea unguium: Secondary | ICD-10-CM | POA: Diagnosis not present

## 2018-12-18 DIAGNOSIS — M79676 Pain in unspecified toe(s): Secondary | ICD-10-CM | POA: Diagnosis not present

## 2018-12-18 DIAGNOSIS — E1142 Type 2 diabetes mellitus with diabetic polyneuropathy: Secondary | ICD-10-CM | POA: Diagnosis not present

## 2019-01-01 DIAGNOSIS — H524 Presbyopia: Secondary | ICD-10-CM | POA: Diagnosis not present

## 2019-01-01 DIAGNOSIS — H5203 Hypermetropia, bilateral: Secondary | ICD-10-CM | POA: Diagnosis not present

## 2019-01-01 DIAGNOSIS — Z961 Presence of intraocular lens: Secondary | ICD-10-CM | POA: Diagnosis not present

## 2019-01-01 DIAGNOSIS — E119 Type 2 diabetes mellitus without complications: Secondary | ICD-10-CM | POA: Diagnosis not present

## 2019-01-29 DIAGNOSIS — L97511 Non-pressure chronic ulcer of other part of right foot limited to breakdown of skin: Secondary | ICD-10-CM | POA: Diagnosis not present

## 2019-02-01 DIAGNOSIS — M79674 Pain in right toe(s): Secondary | ICD-10-CM | POA: Diagnosis not present

## 2019-02-04 ENCOUNTER — Other Ambulatory Visit: Payer: Self-pay

## 2019-02-04 NOTE — Patient Outreach (Signed)
  [facility]) Care Management Chronic Special Needs Program   02/04/2019  Name: Nicole Lloyd, DOB: November 08, 1946  MRN: [id number]  The client was discussed in an interdisciplinary care team meeting.  The following issues were discussed:  Changes in health status, Key risk triggers/risk stratification, Care Plan and Issues/barriers to care  Participants present:  Vania Rea; Peter Garter, RNCM; Thea Silversmith, RNCM  Recommendations/Plan: Diabetes program; send care plan to primary care; send care plan to client; send education.  Thea Silversmith, RN, MSN, [place] [phone]

## 2019-02-05 DIAGNOSIS — M779 Enthesopathy, unspecified: Secondary | ICD-10-CM | POA: Diagnosis not present

## 2019-02-05 DIAGNOSIS — M79674 Pain in right toe(s): Secondary | ICD-10-CM | POA: Diagnosis not present

## 2019-02-26 ENCOUNTER — Ambulatory Visit (INDEPENDENT_AMBULATORY_CARE_PROVIDER_SITE_OTHER): Payer: HMO | Admitting: Cardiology

## 2019-02-26 ENCOUNTER — Encounter: Payer: Self-pay | Admitting: Cardiology

## 2019-02-26 ENCOUNTER — Other Ambulatory Visit: Payer: Self-pay

## 2019-02-26 VITALS — BP 120/80 | Wt 140.0 lb

## 2019-02-26 DIAGNOSIS — Z951 Presence of aortocoronary bypass graft: Secondary | ICD-10-CM

## 2019-02-26 DIAGNOSIS — E7800 Pure hypercholesterolemia, unspecified: Secondary | ICD-10-CM

## 2019-02-26 DIAGNOSIS — I10 Essential (primary) hypertension: Secondary | ICD-10-CM

## 2019-02-26 DIAGNOSIS — I2510 Atherosclerotic heart disease of native coronary artery without angina pectoris: Secondary | ICD-10-CM

## 2019-02-26 NOTE — Progress Notes (Signed)
Subjective:  Primary Physician:  Lucianne Lei, MD  Patient ID: Nicole Lloyd, female    DOB: 16-Jul-1946, 73 y.o.   MRN: [id number]  Chief Complaint  Patient presents with  . Coronary Artery Disease    6 MONTH F/U   This visit type was conducted due to national recommendations for restrictions regarding the COVID-19 Pandemic (e.g. social distancing).  This format is felt to be most appropriate for this patient at this time.  All issues noted in this document were discussed and addressed.  No physical exam was performed (except for noted visual exam findings with Telehealth visits - very limited).  The patient has consented to conduct a Telehealth visit and understands insurance will be billed.   I connected with patient, on 02/26/19  by a video enabled telemedicine application and verified that I am speaking with the correct person using two identifiers.     I discussed the limitations of evaluation and management by telemedicine and the availability of in person appointments. The patient expressed understanding and agreed to proceed.   I have discussed with patient regarding the safety during COVID Pandemic and steps and precautions including social distancing with the patient.   HPI: Nicole Lloyd  is a 73 y.o. female  who presents for a follow-up for Coronary artery disease. Ms. Cotrell is 73 years old African-American female. She had CABG in Mach 2000 at [facility], [place]. The patient had MI prior to CABG. She has cardiac cath in 03/16/2004 by Dr. Rollene Fare- Patent LIMA to LAD, patent SVG to large diagonal, patent sequential radial artery graft to PDA and PLA. Collaterals from circumflex arterial branch to circumflex marginal. EF greater than 55%. Lexiscan Myoview scans were negative for ischemia in December 2018. There was breast tissue attenuation. 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  No complaints of chest pain or shortness of breath. No history of  orthopnea or PND. No palpitation. No dizzinees, near syncope or syncope. No history of swelling on the legs  There is no history of diffuse muscle pains or muscle weakness.  No history of thyroid problems. She has past history of stroke in Aug 2017 and follows with Neurology in [facility]. She also has history of mild anemia. She has history of mild peripheral artery disease without symptoms of claudication with normal ABI in 2013, mild small vessel disease.   Patient has Hypertension, diabetes mellitus type 2 and hyperlipidemia. She does not smoke. She also has h/o GERD. Patient has been walking her dog for at least half mile every day and also goes up and down stairs without difficulty.   Past Medical History:  Diagnosis Date  . Arthritis   . Cholelithiases   . Coronary artery disease   . CVA (cerebral infarction)   . Diabetes mellitus without complication ([place])   . Gall stones   . GERD (gastroesophageal reflux disease)   . Hypertension   . Myocardial infarction ([place])   . Schizophrenia ([place])   . Stroke [facility]) 06/1015   right side weakness, some speech impairment at times    Past Surgical History:  Procedure Laterality Date  . CHOLECYSTECTOMY N/A 09/06/2016   Procedure: LAPAROSCOPIC CHOLECYSTECTOMY WITH INTRAOPERATIVE CHOLANGIOGRAM;  Surgeon: Donnie Mesa, MD;  Location: [place];  Service: General;  Laterality: N/A;  . CORONARY ARTERY BYPASS GRAFT    . oopherectomy Bilateral   . TOTAL ABDOMINAL HYSTERECTOMY    . [place]  History   Socioeconomic History  . Marital status: Widowed    Spouse name: Not on file  . Number of children: Not on file  . Years of education: Not on file  . Highest education level: Not on file  Occupational History  . Not on file  Social Needs  . Financial resource strain: Not on file  . Food insecurity:    Worry: Not on file    Inability: Not on file  . Transportation needs:    Medical: Not on file     Non-medical: Not on file  Tobacco Use  . Smoking status: Never Smoker  . Smokeless tobacco: Never Used  Substance and Sexual Activity  . Alcohol use: No  . Drug use: No  . Sexual activity: Not on file  Lifestyle  . Physical activity:    Days per week: Not on file    Minutes per session: Not on file  . Stress: Not on file  Relationships  . Social connections:    Talks on phone: Not on file    Gets together: Not on file    Attends religious service: Not on file    Active member of club or organization: Not on file    Attends meetings of clubs or organizations: Not on file    Relationship status: Not on file  . Intimate partner violence:    Fear of current or ex partner: Not on file    Emotionally abused: Not on file    Physically abused: Not on file    Forced sexual activity: Not on file  Other Topics Concern  . Not on file  Social History Narrative  . Not on file    Current Outpatient Medications on File Prior to Visit  Medication Sig Dispense Refill  . aspirin 81 MG tablet Take 81 mg by mouth daily.    . clotrimazole-betamethasone (LOTRISONE) cream Apply 1 application topically daily as needed (rash).     Marland Kitchen glimepiride (AMARYL) 1 MG tablet Take 1 mg by mouth daily with breakfast.    . losartan-hydrochlorothiazide (HYZAAR) 100-25 MG tablet Take 1 tablet by mouth daily.    . metFORMIN (GLUCOPHAGE) 1000 MG tablet Take 500 mg by mouth 2 (two) times daily with a meal.     . metoprolol (LOPRESSOR) 50 MG tablet Take 25 mg by mouth 2 (two) times daily.    . Multiple Vitamins-Minerals (WOMENS ONE DAILY) TABS Take 1 tablet by mouth daily.    . Omega-3 1000 MG CAPS Take by mouth.    . Probiotic Product (PRO-BIOTIC BLEND PO) Take by mouth.    . Rosuvastatin Calcium (CRESTOR PO) Take 40 mg by mouth daily.      No current facility-administered medications on file prior to visit.     Review of Systems  Constitutional: Negative for fever.  HENT: Negative for nosebleeds.   Eyes:  Negative for blurred vision.  Respiratory: Negative for cough.   Gastrointestinal: Negative for abdominal pain, nausea and vomiting.  Genitourinary: Negative for dysuria.  Musculoskeletal: Negative for myalgias.  Skin: Negative for itching and rash.  Neurological: Negative for dizziness and loss of consciousness.  Psychiatric/Behavioral: The patient is not nervous/anxious.          Objective:  Blood pressure 120/80, weight 140 lb (63.5 kg). Body mass index is 23.3 kg/m.  Physical Exam  Patient is alert and oriented 3.  She appeared very comfortable and in good spirits.  No further detailed examination was possible as it was a  telemedicine visit.  CARDIAC STUDIES:  Cath- 03/16/2004 by Dr. Rollene Fare- Patent LIMA to LAD, patent SVG to large diagonal, patent sequential radial artery graft to PDA and PLA. Collaterals from circumflex arterial branch to circumflex marginal. EF greater than 55%. Lexiscan myoview stress test 10/27/2017: 1. Pharmacologic stress testing was performed with intravenous administration of .4 mg of Lexiscan over a 10-15 seconds infusion. Stress EKG is non diagnostic for ischemia as it is a pharmacologic stress. 2. The overall quality of the study is good. Gated SPECT images reveal normal myocardial thickening and wall motion. The left ventricular ejection fraction was calculated or visually estimated to be 71%. REST images demonstrate decreased tracer uptake in the basal inferoseptal and basal inferior segments of the left ventricle, that improve on STRESS images. These defects are related to breast tissue attenuation, with study performed in sitting position. 3. This is a low risk study.  Assessment & Recommendations:   1. Atherosclerosis of native coronary artery of native heart without angina pectoris  2. Status post four vessel coronary artery bypass  3. Essential hypertension  4. Hypercholesterolemia.  Laboratory Exam:  Recommendation:  CAD is stable,  patient does not have any angina.  Blood pressure is well controlled with therapy. Patient was advised to continue present medications for blood pressure and cholesterol. Continue low-dose aspirin.  Secondary prevention was again discussed with her. She was advised to follow ADA, low-salt, low-cholesterol diet. She was encouraged to continue walking regularly.  I will see her in follow-up after 6 months. Patient was advised to call us if there are any cardiac problems in the interim. Patient said she had complete blood tests recently at her PCP's office.  We will request for those results.   Despina Hick, MD, [facility] 02/26/2019, 11:13 AM [facility]. [place] Pager: [phone] Office: [phone] If no answer Cell [phone]

## 2019-03-08 ENCOUNTER — Other Ambulatory Visit: Payer: Self-pay | Admitting: Cardiology

## 2019-03-08 NOTE — Telephone Encounter (Signed)
Please fill

## 2019-04-11 DIAGNOSIS — Z1211 Encounter for screening for malignant neoplasm of colon: Secondary | ICD-10-CM | POA: Diagnosis not present

## 2019-04-11 DIAGNOSIS — R159 Full incontinence of feces: Secondary | ICD-10-CM | POA: Diagnosis not present

## 2019-04-11 DIAGNOSIS — E669 Obesity, unspecified: Secondary | ICD-10-CM | POA: Diagnosis not present

## 2019-04-11 DIAGNOSIS — R1033 Periumbilical pain: Secondary | ICD-10-CM | POA: Diagnosis not present

## 2019-04-11 DIAGNOSIS — R152 Fecal urgency: Secondary | ICD-10-CM | POA: Diagnosis not present

## 2019-04-22 ENCOUNTER — Other Ambulatory Visit: Payer: Self-pay

## 2019-04-22 NOTE — Patient Outreach (Signed)
  [place] [facility]) Care Management Chronic Special Needs Program  04/22/2019  Name: Nicole Lloyd DOB: 12/16/1945  MRN: [id number]  Nicole Lloyd is enrolled in a Chronic Special Needs Plan. RNCM called to follow up and review health risk assessment. RNCM called x2. No answer. Unable to leave message.  Plan: Chronic care management coordinator will attempt outreach in 1-2 weeks.  Thea Silversmith, RN, MSN, [place] [phone]

## 2019-04-23 DIAGNOSIS — Z6832 Body mass index (BMI) 32.0-32.9, adult: Secondary | ICD-10-CM | POA: Diagnosis not present

## 2019-04-23 DIAGNOSIS — E785 Hyperlipidemia, unspecified: Secondary | ICD-10-CM | POA: Diagnosis not present

## 2019-04-23 DIAGNOSIS — Z0000 Encounter for general adult medical examination without abnormal findings: Secondary | ICD-10-CM | POA: Diagnosis not present

## 2019-04-23 DIAGNOSIS — E1169 Type 2 diabetes mellitus with other specified complication: Secondary | ICD-10-CM | POA: Diagnosis not present

## 2019-04-23 DIAGNOSIS — F4320 Adjustment disorder, unspecified: Secondary | ICD-10-CM | POA: Diagnosis not present

## 2019-04-23 DIAGNOSIS — I10 Essential (primary) hypertension: Secondary | ICD-10-CM | POA: Diagnosis not present

## 2019-04-29 ENCOUNTER — Ambulatory Visit: Payer: Self-pay

## 2019-05-03 ENCOUNTER — Other Ambulatory Visit: Payer: Self-pay

## 2019-05-03 NOTE — Patient Outreach (Signed)
  [place] [facility]) Care Management Chronic Special Needs Program    05/03/2019  Name: Nicole Lloyd, DOB: [birth year]  MRN: [id number]   [name] is enrolled in a chronic special needs plan. RNCM called to follow up and review health risk assessment. No answer. Unable to leave message. 2nd outreach attempt.  Plan: Chronic care management coordinator will attempt outreach in 1-2 weeks.  Thea Silversmith, RN, MSN, [place] [phone]

## 2019-05-07 ENCOUNTER — Other Ambulatory Visit: Payer: Self-pay

## 2019-05-07 NOTE — Patient Outreach (Signed)
  [place] [facility]) Care Management Chronic Special Needs Program    05/07/2019  Name: Nicole Lloyd, DOB: [birth year]  MRN: [id number]   Nicole Lloyd is enrolled in a chronic special needs plan. RNCM called to follow up and review care plan. No answer. Unable to leave message. 3rd attempt.  Plan: RNCM will send unsuccessful outreach letter. Continue to follow.  Thea Silversmith, RN, MSN, [place] [phone]

## 2019-06-20 DIAGNOSIS — E1142 Type 2 diabetes mellitus with diabetic polyneuropathy: Secondary | ICD-10-CM | POA: Diagnosis not present

## 2019-06-20 DIAGNOSIS — B351 Tinea unguium: Secondary | ICD-10-CM | POA: Diagnosis not present

## 2019-06-20 DIAGNOSIS — L84 Corns and callosities: Secondary | ICD-10-CM | POA: Diagnosis not present

## 2019-06-20 DIAGNOSIS — M79676 Pain in unspecified toe(s): Secondary | ICD-10-CM | POA: Diagnosis not present

## 2019-07-09 ENCOUNTER — Other Ambulatory Visit: Payer: Self-pay

## 2019-07-09 DIAGNOSIS — I2510 Atherosclerotic heart disease of native coronary artery without angina pectoris: Secondary | ICD-10-CM

## 2019-07-09 MED ORDER — METOPROLOL TARTRATE 50 MG PO TABS: 25.0000 mg | ORAL_TABLET | Freq: Two times a day (BID) | ORAL | 3 refills | Status: DC

## 2019-08-29 DIAGNOSIS — I10 Essential (primary) hypertension: Secondary | ICD-10-CM | POA: Diagnosis not present

## 2019-08-29 DIAGNOSIS — B351 Tinea unguium: Secondary | ICD-10-CM | POA: Diagnosis not present

## 2019-08-29 DIAGNOSIS — M79676 Pain in unspecified toe(s): Secondary | ICD-10-CM | POA: Diagnosis not present

## 2019-08-29 DIAGNOSIS — F4320 Adjustment disorder, unspecified: Secondary | ICD-10-CM | POA: Diagnosis not present

## 2019-08-29 DIAGNOSIS — E1169 Type 2 diabetes mellitus with other specified complication: Secondary | ICD-10-CM | POA: Diagnosis not present

## 2019-08-29 DIAGNOSIS — L84 Corns and callosities: Secondary | ICD-10-CM | POA: Diagnosis not present

## 2019-08-29 DIAGNOSIS — E782 Mixed hyperlipidemia: Secondary | ICD-10-CM | POA: Diagnosis not present

## 2019-08-29 DIAGNOSIS — M130 Polyarthritis, unspecified: Secondary | ICD-10-CM | POA: Diagnosis not present

## 2019-08-29 DIAGNOSIS — E1151 Type 2 diabetes mellitus with diabetic peripheral angiopathy without gangrene: Secondary | ICD-10-CM | POA: Diagnosis not present

## 2019-09-03 ENCOUNTER — Ambulatory Visit: Payer: HMO | Admitting: Cardiology

## 2019-09-09 ENCOUNTER — Other Ambulatory Visit: Payer: Self-pay

## 2019-09-09 ENCOUNTER — Ambulatory Visit (INDEPENDENT_AMBULATORY_CARE_PROVIDER_SITE_OTHER): Payer: HMO | Admitting: Cardiology

## 2019-09-09 ENCOUNTER — Encounter: Payer: Self-pay | Admitting: Cardiology

## 2019-09-09 VITALS — BP 116/64 | HR 72 | Ht 65.0 in | Wt 188.0 lb

## 2019-09-09 DIAGNOSIS — Z7409 Other reduced mobility: Secondary | ICD-10-CM | POA: Insufficient documentation

## 2019-09-09 DIAGNOSIS — I25810 Atherosclerosis of coronary artery bypass graft(s) without angina pectoris: Secondary | ICD-10-CM

## 2019-09-09 DIAGNOSIS — J309 Allergic rhinitis, unspecified: Secondary | ICD-10-CM | POA: Insufficient documentation

## 2019-09-09 DIAGNOSIS — Z8673 Personal history of transient ischemic attack (TIA), and cerebral infarction without residual deficits: Secondary | ICD-10-CM

## 2019-09-09 DIAGNOSIS — Z951 Presence of aortocoronary bypass graft: Secondary | ICD-10-CM

## 2019-09-09 NOTE — Progress Notes (Signed)
Follow up visit  Subjective:   Nicole Lloyd, female    DOB: 02/20/1946, 73 y.o.   MRN: [id number]   Chief Complaint  Patient presents with  . Congestive Heart Failure  . Follow-up    6 month  . Chest Pain  . Coronary Artery Disease     HPI  73 year old African-American female with hypertension, type 2 diabetes mellitus, CAD s/p CABG x4 (LIMA-LAD, SVG-diagonal, sequential radial artery graft-PDA/PLA), h/o R parietal/ L pontine stroke (2016), h/o b/l vertebral known stroke, ? h/oschizophrenia.  Until recently, patient did independently.  Now, she takes turns living with her children.  She walks regularly for a mile or more without any complaints of chest pain, shortness of breath.  She is to see a neurologist at [facility], but reportedly the neurologist is normal.  She does not currently see one.  She reportedly was asked to resume Aggrenox at her visit in April 2019, but she currently seems to be on aspirin 81 mg without Aggrenox. She denies any recent TIA/stroke symptoms.     Past Medical History:  Diagnosis Date  . Arthritis   . Cholelithiases   . Coronary artery disease   . CVA (cerebral infarction)   . Diabetes mellitus without complication ([place])   . Gall stones   . GERD (gastroesophageal reflux disease)   . Hypertension   . Myocardial infarction ([place])   . Schizophrenia ([place])   . Stroke [facility]) 06/1015   right side weakness, some speech impairment at times     Past Surgical History:  Procedure Laterality Date  . CHOLECYSTECTOMY N/A 09/06/2016   Procedure: LAPAROSCOPIC CHOLECYSTECTOMY WITH INTRAOPERATIVE CHOLANGIOGRAM;  Surgeon: Donnie Mesa, MD;  Location: [place];  Service: General;  Laterality: N/A;  . CORONARY ARTERY BYPASS GRAFT    . oopherectomy Bilateral   . TOTAL ABDOMINAL HYSTERECTOMY    . VENTRAL HERNIA REPAIR       Social History   Socioeconomic History  . Marital status: Widowed    Spouse name: Not on file  . Number of children: Not on file   . Years of education: Not on file  . Highest education level: Not on file  Occupational History  . Not on file  Social Needs  . Financial resource strain: Not on file  . Food insecurity    Worry: Not on file    Inability: Not on file  . Transportation needs    Medical: Not on file    Non-medical: Not on file  Tobacco Use  . Smoking status: Never Smoker  . Smokeless tobacco: Never Used  Substance and Sexual Activity  . Alcohol use: No  . Drug use: No  . Sexual activity: Not on file  Lifestyle  . Physical activity    Days per week: Not on file    Minutes per session: Not on file  . Stress: Not on file  Relationships  . Social Herbalist on phone: Not on file    Gets together: Not on file    Attends religious service: Not on file    Active member of club or organization: Not on file    Attends meetings of clubs or organizations: Not on file    Relationship status: Not on file  . Intimate partner violence    Fear of current or ex partner: Not on file    Emotionally abused: Not on file    Physically abused: Not on file    Forced sexual activity: Not  on file  Other Topics Concern  . Not on file  Social History Narrative  . Not on file     Family History  Problem Relation Age of Onset  . Arthritis Mother   . CVA Mother   . Depression Mother   . Diabetes Mother   . Hypertension Mother   . Seizures Mother   . Alcohol abuse Brother   . Cervical cancer Daughter      Current Outpatient Medications on File Prior to Visit  Medication Sig Dispense Refill  . aspirin 81 MG tablet Take 81 mg by mouth daily.    . calcium carbonate (TUMS - DOSED IN MG ELEMENTAL CALCIUM) 500 MG chewable tablet Chew 1 tablet by mouth daily.    . cephALEXin (KEFLEX) 500 MG capsule Take 1 capsule by mouth daily.    . clotrimazole-betamethasone (LOTRISONE) cream Apply 1 application topically daily as needed (rash).     Marland Kitchen glimepiride (AMARYL) 1 MG tablet Take 1 mg by mouth daily with  breakfast.    . losartan-hydrochlorothiazide (HYZAAR) 100-25 MG tablet Take 1 tablet by mouth daily.    . metFORMIN (GLUCOPHAGE) 1000 MG tablet Take 500 mg by mouth 2 (two) times daily with a meal.     . metoprolol tartrate (LOPRESSOR) 50 MG tablet Take 0.5 tablets (25 mg total) by mouth 2 (two) times daily. 60 tablet 3  . Multiple Vitamins-Minerals (WOMENS ONE DAILY) TABS Take 1 tablet by mouth daily.    . Probiotic Product (PRO-BIOTIC BLEND PO) Take by mouth.    . Omega-3 1000 MG CAPS Take by mouth.    . rosuvastatin (CRESTOR) 40 MG tablet TAKE 1 TABLET BY MOUTH EVERY DAY 90 tablet 1   No current facility-administered medications on file prior to visit.     Cardiovascular studies:  EKG 09/09/2019: Sinus rhythm 71 bpm. Nonspecific T-abnormality.      Lexiscan myoview stress test 10/27/2017: 1. Pharmacologic stress testing was performed with intravenous administration of .4 mg of Lexiscan over a 10-15 seconds infusion. Stress EKG is non diagnostic for ischemia as it is a pharmacologic stress.  2. The overall quality of the study is good. Gated SPECT images reveal normal myocardial thickening and wall motion.  The left ventricular ejection fraction was calculated or visually estimated to be 71%.  REST images demonstrate decreased tracer uptake in the basal inferoseptal and basal inferior segments of the left ventricle, that improve on STRESS images. These defects are related to breast tissue attenuation, with study performed in sitting position. 3. This is a low risk study.   Recent labs: 11/24/2017: BUN/Cr 16/0.9. Na/K 141/4.8 H/H 10.8/32.7. Platelets 170. HbA1C 5.7%   Review of Systems  Constitution: Negative for decreased appetite, malaise/fatigue, weight gain and weight loss.  HENT: Negative for congestion.   Eyes: Negative for visual disturbance.  Cardiovascular: Negative for chest pain, dyspnea on exertion, leg swelling, palpitations and syncope.  Respiratory: Negative for  cough.   Endocrine: Negative for cold intolerance.  Hematologic/Lymphatic: Does not bruise/bleed easily.  Skin: Negative for itching and rash.  Musculoskeletal: Negative for myalgias.  Gastrointestinal: Negative for abdominal pain, nausea and vomiting.  Genitourinary: Negative for dysuria.  Neurological: Negative for dizziness and weakness.  Psychiatric/Behavioral: The patient is not nervous/anxious.   All other systems reviewed and are negative.        Vitals:   09/09/19 1008  BP: 116/64  Pulse: 72  SpO2: 96%     Body mass index is 31.28 kg/m. Filed Weights  09/09/19 1008  Weight: 188 lb (85.3 kg)     Objective:   Physical Exam  Constitutional: She is oriented to person, place, and time. She appears well-developed and well-nourished. No distress.  HENT:  Head: Normocephalic and atraumatic.  Eyes: Pupils are equal, round, and reactive to light. Conjunctivae are normal.  Neck: No JVD present.  Cardiovascular: Normal rate, regular rhythm and intact distal pulses.  No murmur heard. Pulmonary/Chest: Effort normal and breath sounds normal. She has no wheezes. She has no rales.  Sternotomy scar  Abdominal: Soft. Bowel sounds are normal. There is no rebound.  Musculoskeletal:        General: No edema.  Lymphadenopathy:    She has no cervical adenopathy.  Neurological: She is alert and oriented to person, place, and time. No cranial nerve deficit.  Skin: Skin is warm and dry.  Psychiatric: She has a normal mood and affect.  Nursing note and vitals reviewed.         Assessment & Recommendations:   73 year old African-American female with hypertension, type 2 diabetes mellitus, CAD s/p CABG x4 (LIMA-LAD, SVG-diagonal, sequential radial artery graft-PDA/PLA), h/o stroke, schizophrenia.  CAD: S/p CABG. Currently on aspirin, crestor 40 mg, losartan-HCTZ 100-25 mg, metoprolol tartarate 25 mg bid. I will obtain labs from PCP to review her current lipid panel.   Hypertension: Controlled.  H/o stroke: Currently asymptomatic. On Aspirin 81 mg daily-not on Aggrenox as previously recommended by neurology.  I have encouraged him to talk to his PCP regarding establishing care with a neurologist, as necessary.  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  I will obtain a carotid/vertebral artery ultrasound to evaluate this.  Type 2 DM: I do not have any recent labs available, thus unaware of her renal function. If it would allow, consider adding Jard[place] for CV benefits.   F/u in 1 year.    Nigel Mormon, MD [facility] [place] Pager: [phone] Office: [phone] If no answer Cell [phone]

## 2019-09-12 ENCOUNTER — Other Ambulatory Visit: Payer: Self-pay

## 2019-09-12 NOTE — Patient Outreach (Addendum)
[place] [facility]) Care Management Chronic Special Needs Program  09/12/2019  Name: Nicole Lloyd DOB: Jul 22, 1946  MRN: [id number]  Nicole Lloyd is enrolled in a chronic special needs plan for Diabetes. Reviewed and updated care plan.  RNCM returned call to client.   Subjective:  She report a history of diabetes, heart disease, stroke, heart attach.  She lives with her daughter/family, but is independent in the home. She states she cooks and loves to eat. She reports she walks about a mile to a mile and a half daily with the dog. Client reports she does not check her blood sugar stating she was not told it needed to be checked. She reports she does not have a meter. She states she can tell if her blood sugar is getting low and she will eat or have some hard candy. She attend provider visits and denies any transportation problems or difficulty obtaining medications. Client reports she is doing well. She denies any issue or concerns.  Goals Addressed            This Visit's Progress   . COMPLETED:  Acknowledge receipt of Advanced Directive package       Verbalized received copy of advanced directive packet.    .  Diabetes Patient stated goal: "Keep weight Maintained". (pt-stated)   Not on track    Please review educational material provided: weight loss tips; type 2 diabetes food and exercise and walking to fitness.    . COMPLETED: Advanced Care Planning complete as directed by client within the next 6-9 months.       Client express will complete at her leisure.    . COMPLETED: Client understands the importance of follow-up with providers by attending scheduled visits       Voiced understanding of importance of provider visit.    . COMPLETED: Client verbalizes knowledge of Heart Attack self management skills within 6-9 months.       Take medications, walk, drink more water than I do soda, don't smoke, follow up with doctor.    . COMPLETED: Client will not report  change from baseline and no repeated symptoms of stroke with in the next the next 6-9 months.       Denies no change from baseline.     . COMPLETED: Client will report no worsening of symptoms related to heart disease within the next within the next 6-9 months.       Denies any symptoms or worsening    . Client will use Assistive Devices as needed and verbalize understanding of device use   On track   . COMPLETED: Decrease the use of hospital emergency department related to diabetes within the next year        No use of emergency department    . COMPLETED: Maintain timely refills of diabetic medication as prescribed within the year .      Marland Kitchen COMPLETED: Obtain annual  Lipid Profile, LDL-C       Done 08/29/2019    . COMPLETED: Obtain Annual Eye (retinal)  Exam        Done 12/22/2018    . COMPLETED: Obtain Annual Foot Exam       per client 08/29/2019    . Obtain annual screen for micro albuminuria (urine) , nephropathy (kidney problems)   On track   . COMPLETED: Obtain Hemoglobin A1C at least 2 times per year       Per client has been checked at least 2 times  this year.    . COMPLETED: Visit Primary Care Provider or Endocrinologist at least 2 times per year        Done per client-11/2018 and 05/2019/08/2019       RNCM discussed the importance of having a glucose meter to be able to check blood sugar is needed.    Covid-19 precautions discussed. RNCM encouraged client to call the 24 hour advice line as needed; encouraged client to call the health care concierge for benefits questions as needed. RNCM also encouraged client to call RNCM, confirmed client has contact number.  Plan: RNCM will send updated care plan to client and to primary care provider. RNCM warm transferred client to health care concierge regarding covered glucometers.  Chronic care management coordinator will outreach within 3-6 months.    Thea Silversmith, RN, MSN, [place] [phone]   .

## 2019-09-19 ENCOUNTER — Other Ambulatory Visit: Payer: HMO

## 2019-10-01 ENCOUNTER — Other Ambulatory Visit: Payer: Self-pay

## 2019-10-01 ENCOUNTER — Ambulatory Visit (INDEPENDENT_AMBULATORY_CARE_PROVIDER_SITE_OTHER): Payer: HMO

## 2019-10-01 DIAGNOSIS — I6522 Occlusion and stenosis of left carotid artery: Secondary | ICD-10-CM | POA: Diagnosis not present

## 2019-10-01 DIAGNOSIS — Z8673 Personal history of transient ischemic attack (TIA), and cerebral infarction without residual deficits: Secondary | ICD-10-CM | POA: Diagnosis not present

## 2019-10-05 ENCOUNTER — Other Ambulatory Visit: Payer: Self-pay | Admitting: Cardiology

## 2019-10-05 DIAGNOSIS — Z8673 Personal history of transient ischemic attack (TIA), and cerebral infarction without residual deficits: Secondary | ICD-10-CM

## 2019-10-05 DIAGNOSIS — I6522 Occlusion and stenosis of left carotid artery: Secondary | ICD-10-CM

## 2019-10-07 NOTE — Progress Notes (Signed)
Unable to leave vm.

## 2019-10-13 ENCOUNTER — Other Ambulatory Visit: Payer: Self-pay | Admitting: Cardiology

## 2019-10-16 ENCOUNTER — Other Ambulatory Visit: Payer: Self-pay

## 2019-10-21 ENCOUNTER — Ambulatory Visit: Payer: Self-pay

## 2019-12-04 ENCOUNTER — Other Ambulatory Visit: Payer: Self-pay

## 2019-12-04 NOTE — Patient Outreach (Signed)
  [place] [facility]) Care Management Chronic Special Needs Program    12/04/2019  Name: Nicole Lloyd, DOB: Jun 27, 1946  MRN: [id number]   [name] is enrolled in a chronic special needs plan for Diabetes. RNCM called to follow up, review health risk assessment and individualized care plan. No answer, unable to leave voice message.  Plan: RNCM will outreach within the next 2-3 weeks.  Thea Silversmith, RN, MSN, [place] [phone]

## 2019-12-18 ENCOUNTER — Ambulatory Visit: Payer: Self-pay

## 2019-12-25 ENCOUNTER — Ambulatory Visit: Payer: Self-pay

## 2019-12-27 ENCOUNTER — Other Ambulatory Visit: Payer: Self-pay

## 2019-12-27 NOTE — Patient Outreach (Signed)
  [place] [facility]) Care Management Chronic Special Needs Program    12/27/2019  Name: LUCILL ABRAHAMSEN, DOB: [birth year]  MRN: [id number]   Ms. Akevia Miesner is enrolled in a chronic special needs plan for Diabetes. RNCM called to review health risk assessment and update care plan. No answer. HIPPA compliant message left. 2nd outreach attempt.  Plan: RNCM will outreach within the next 1-2 weeks.   Thea Silversmith, RN, MSN, [place] [phone]

## 2019-12-30 DIAGNOSIS — F3289 Other specified depressive episodes: Secondary | ICD-10-CM | POA: Diagnosis not present

## 2019-12-30 DIAGNOSIS — E1169 Type 2 diabetes mellitus with other specified complication: Secondary | ICD-10-CM | POA: Diagnosis not present

## 2019-12-30 DIAGNOSIS — E782 Mixed hyperlipidemia: Secondary | ICD-10-CM | POA: Diagnosis not present

## 2019-12-30 DIAGNOSIS — I10 Essential (primary) hypertension: Secondary | ICD-10-CM | POA: Diagnosis not present

## 2019-12-30 DIAGNOSIS — M130 Polyarthritis, unspecified: Secondary | ICD-10-CM | POA: Diagnosis not present

## 2020-01-02 ENCOUNTER — Other Ambulatory Visit: Payer: Self-pay

## 2020-01-02 NOTE — Patient Outreach (Signed)
  [place] [facility]) Care Management Chronic Special Needs Program    01/02/2020  Name: Nicole Lloyd, DOB: [birth year]  MRN: [id number]   [name] is enrolled in a chronic special needs plan. RNCM received notification that client request educational material on diet/exercise.  Plan: [facility] sent educational material and will follow up call within 1-2 weeks to discuss.  Thea Silversmith, RN, MSN, [place] [phone]

## 2020-01-03 DIAGNOSIS — E119 Type 2 diabetes mellitus without complications: Secondary | ICD-10-CM | POA: Diagnosis not present

## 2020-01-03 DIAGNOSIS — Z961 Presence of intraocular lens: Secondary | ICD-10-CM | POA: Diagnosis not present

## 2020-01-03 DIAGNOSIS — H524 Presbyopia: Secondary | ICD-10-CM | POA: Diagnosis not present

## 2020-01-09 ENCOUNTER — Ambulatory Visit: Payer: Self-pay

## 2020-01-10 ENCOUNTER — Other Ambulatory Visit: Payer: Self-pay

## 2020-01-10 NOTE — Patient Outreach (Signed)
[place] [facility]) Care Management Chronic Special Needs Program  01/10/2020  Name: JUDEEN CASABLANCA DOB: 1946-03-02  MRN: [id number]  Ms. Elayne Tolsma is enrolled in a chronic special needs plan for Diabetes. Client has not responded to three outreach attempts.  The client's individualized care plan was developed based upon the completed health risk assessment.  Goals Addressed            This Visit's Progress   .  Diabetes Patient stated goal: "Keep weight Maintained". (pt-stated)   On track    Please review educational material provided: weight loss tips; type 2 diabetes food and exercise and walking to fitness. Mailed education: "exercise and aging". Please review and call if you have any questions.    . Client verbalize knowledge of Heart Failure disease self management skills within the next 9-12 months.   On track    Heart failure self-management actions: Know signs and symptoms of congestive heart failure exacerbation. Know when to call the doctor to who to call. Verbalize how fluid intake can affect congestive heart failure. Eat healthy and monitor salt intake. Visit your primary care or cardiologist as scheduled. Know the importance of daily weights. Mailed education: "Heart Failure". Please review and call if you have any questions.    . Client verbalizes knowledge of Heart Attack self management skills within 6-9 months.   On track    Self management skills: Attend provider visits as scheduled. Take your medications as prescribed. Mailed education: "heart attack". Please review and call if you have any questions. Signs/symptoms: chest discomfort; shortness of breath; breaking out in cold sweat, nausea or lightheadedness Call 911 for symptoms     . Client will not report change from baseline and no repeated symptoms of stroke with in the next the next 6-9 months.   On track    Mailed education: "Lowering risk of having another stroke". Please review  and call if you have any questions. Please follow up with your providers visits as scheduled.  Stroke Symptoms: Spot a stroke F.A.S.T. FACE DROOPING Does one side of the face droop or is it numb? Ask the person to smile. ARM WEAKNESS Is one arm weak or numb? Ask the person to raise both arms. Does one arm drift downward? SPEECH DIFFICULTY Is speech slurred, are they unable to speak, or are they hard to understand? Ask the person to repeat a simple sentence, like "the sky is blue." Is the sentence repeated correctly? TIME TO CALL 9-1-1 If the person shows any of these symptoms, even if the symptoms go away, call 9-1-1 and get them to the hospital immediately.    Marland Kitchen HEMOGLOBIN A1C < 7.0       Per chart-last A1C 7.8 on 12/30/2019  Diabetes self management actions:  Glucose monitoring per provider recommendations  Eat Healthy  Check feet daily  Visit provider every 3-6 months as directed  Hbg A1C level every 3-6 months.  Eye Exam yearly  Ask your doctor, "what is my Target A1C goal?"  Ask your doctor, "what is my blood sugar range?"    . Maintain timely refills of diabetic medication as prescribed within the year .   On track    It is important to attend scheduled visits with your doctor for yearly exam and recommended labs/procedures and timely refills on medications.    . Obtain annual  Lipid Profile, LDL-C   On track    It is important to attend scheduled visits with your doctor for  yearly exam and recommended labs/procedures.    . Obtain Annual Eye (retinal)  Exam    On track    It is important to attend scheduled visits with your doctor for yearly exam. Done 01/03/2020    . Obtain Annual Foot Exam   On track    It is important to attend scheduled visits with your doctor for yearly exam.    . Obtain annual screen for micro albuminuria (urine) , nephropathy (kidney problems)   On track    Continue goal( 01/10/2020) It is important for you to see your doctor for yearly exam  and recommended test and procedures.    . Obtain Hemoglobin A1C at least 2 times per year   On track    It is important to attend scheduled visits with your doctor for yearly exam and recommended labs/procedures.    . Visit Primary Care Provider or Endocrinologist at least 2 times per year    On track    It is important to attend scheduled visits with your doctor for yearly exam and recommended labs/procedures.      Plan:   . Send unsuccessful outreach letter with a copy of individualized care plan to client . Send individualized care plan to provider . Send Neurosurgeon . Tier level increased to 2 as client has noted diagnosis of both heart failure and diabetes, inconsistencies in blood sugar monitoring/self management.  Chronic care management coordinator will attempt outreach per tier level within 6 months.Thea Silversmith, RN, MSN, [place] [phone]

## 2020-02-26 DIAGNOSIS — Z111 Encounter for screening for respiratory tuberculosis: Secondary | ICD-10-CM | POA: Diagnosis not present

## 2020-03-09 ENCOUNTER — Other Ambulatory Visit: Payer: Self-pay | Admitting: Cardiology

## 2020-03-09 DIAGNOSIS — I2510 Atherosclerotic heart disease of native coronary artery without angina pectoris: Secondary | ICD-10-CM

## 2020-03-11 ENCOUNTER — Other Ambulatory Visit: Payer: Self-pay

## 2020-03-11 MED ORDER — LOSARTAN POTASSIUM-HCTZ 100-25 MG PO TABS: 1.0000 | ORAL_TABLET | Freq: Every day | ORAL | 1 refills | Status: DC

## 2020-03-20 DIAGNOSIS — M150 Primary generalized (osteo)arthritis: Secondary | ICD-10-CM | POA: Diagnosis not present

## 2020-03-20 DIAGNOSIS — I10 Essential (primary) hypertension: Secondary | ICD-10-CM | POA: Diagnosis not present

## 2020-03-20 DIAGNOSIS — E1100 Type 2 diabetes mellitus with hyperosmolarity without nonketotic hyperglycemic-hyperosmolar coma (NKHHC): Secondary | ICD-10-CM | POA: Diagnosis not present

## 2020-03-20 DIAGNOSIS — M7581 Other shoulder lesions, right shoulder: Secondary | ICD-10-CM | POA: Diagnosis not present

## 2020-03-20 DIAGNOSIS — M542 Cervicalgia: Secondary | ICD-10-CM | POA: Diagnosis not present

## 2020-03-20 DIAGNOSIS — E785 Hyperlipidemia, unspecified: Secondary | ICD-10-CM | POA: Diagnosis not present

## 2020-03-31 DIAGNOSIS — M79676 Pain in unspecified toe(s): Secondary | ICD-10-CM | POA: Diagnosis not present

## 2020-03-31 DIAGNOSIS — B351 Tinea unguium: Secondary | ICD-10-CM | POA: Diagnosis not present

## 2020-03-31 DIAGNOSIS — L84 Corns and callosities: Secondary | ICD-10-CM | POA: Diagnosis not present

## 2020-03-31 DIAGNOSIS — E1151 Type 2 diabetes mellitus with diabetic peripheral angiopathy without gangrene: Secondary | ICD-10-CM | POA: Diagnosis not present

## 2020-04-03 DIAGNOSIS — E6609 Other obesity due to excess calories: Secondary | ICD-10-CM | POA: Diagnosis not present

## 2020-04-03 DIAGNOSIS — I10 Essential (primary) hypertension: Secondary | ICD-10-CM | POA: Diagnosis not present

## 2020-04-03 DIAGNOSIS — E1169 Type 2 diabetes mellitus with other specified complication: Secondary | ICD-10-CM | POA: Diagnosis not present

## 2020-04-03 DIAGNOSIS — E785 Hyperlipidemia, unspecified: Secondary | ICD-10-CM | POA: Diagnosis not present

## 2020-04-10 DIAGNOSIS — E782 Mixed hyperlipidemia: Secondary | ICD-10-CM | POA: Diagnosis not present

## 2020-04-10 DIAGNOSIS — M130 Polyarthritis, unspecified: Secondary | ICD-10-CM | POA: Diagnosis not present

## 2020-04-10 DIAGNOSIS — F4320 Adjustment disorder, unspecified: Secondary | ICD-10-CM | POA: Diagnosis not present

## 2020-04-10 DIAGNOSIS — I10 Essential (primary) hypertension: Secondary | ICD-10-CM | POA: Diagnosis not present

## 2020-04-11 NOTE — Progress Notes (Signed)
Follow up visit  Subjective:   Nicole Lloyd, female    DOB: 1946/08/15, 74 y.o.   MRN: [id number]   Chief Complaint  Patient presents with  . Jaw Pain  . Arm Pain  . Follow-up  . Coronary Artery Disease     HPI  74 year old African-American female with hypertension, type 2 diabetes mellitus, CAD s/p CABG x4 (LIMA-LAD, SVG-diagonal, sequential radial artery graft-PDA/PLA), h/o AVNRT ablation, h/o R parietal/ L pontine stroke (2016), h/o b/l vertebral known stroke, h/o schizophrenia.  Patient has had episodes of retrosternal chest pressure with radiation to jaw with exertion. She also has complaints of tingling in her legs. She has not seen a neurologist anytime recently. Also concerning is her complaints of visual hallucinations of seeing her dead son "flying around the house". She has not expressed this to her PCP before.    Current Outpatient Medications on File Prior to Visit  Medication Sig Dispense Refill  . aspirin 81 MG tablet Take 81 mg by mouth daily.    . calcium carbonate (TUMS - DOSED IN MG ELEMENTAL CALCIUM) 500 MG chewable tablet Chew 1 tablet by mouth daily.    . cephALEXin (KEFLEX) 500 MG capsule Take 1 capsule by mouth daily.    . clotrimazole-betamethasone (LOTRISONE) cream Apply 1 application topically daily as needed (rash).     Marland Kitchen glimepiride (AMARYL) 1 MG tablet Take 1 mg by mouth daily with breakfast.    . losartan-hydrochlorothiazide (HYZAAR) 100-25 MG tablet Take 1 tablet by mouth daily. 90 tablet 1  . metFORMIN (GLUCOPHAGE) 1000 MG tablet Take 500 mg by mouth 2 (two) times daily with a meal.     . metoprolol tartrate (LOPRESSOR) 50 MG tablet TAKE 1/2 TABLET BY MOUTH TWICE DAILY 60 tablet 3  . Multiple Vitamins-Minerals (WOMENS ONE DAILY) TABS Take 1 tablet by mouth daily.    . Omega-3 1000 MG CAPS Take by mouth.    . Probiotic Product (PRO-BIOTIC BLEND PO) Take by mouth.    . rosuvastatin (CRESTOR) 40 MG tablet TAKE 1 TABLET BY MOUTH EVERY DAY 90  tablet 1   No current facility-administered medications on file prior to visit.    Cardiovascular studies:  EKG 04/13/2020: Sinus rhythm 66 bpm Nonspecific T-abnormality  Carotid artery duplex 78/29/5621:  Peak systolic velocities in the right bifurcation, internal, external and  common carotid arteries are within normal limits.  Doppler flow velocities in the left internal carotid artery are consistent  with stenosis in the range of 16-49% with moderate heterogeneous plaque.  Antegrade right vertebral artery flow. Antegrade left vertebral artery  flow.  Follow up in one year is appropriate if clinically indicated.  Lexiscan myoview stress test 10/27/2017: 1. Pharmacologic stress testing was performed with intravenous administration of .4 mg of Lexiscan over a 10-15 seconds infusion. Stress EKG is non diagnostic for ischemia as it is a pharmacologic stress.  2. The overall quality of the study is good. Gated SPECT images reveal normal myocardial thickening and wall motion.  The left ventricular ejection fraction was calculated or visually estimated to be 71%.  REST images demonstrate decreased tracer uptake in the basal inferoseptal and basal inferior segments of the left ventricle, that improve on STRESS images. These defects are related to breast tissue attenuation, with study performed in sitting position. 3. This is a low risk study.   Recent labs: 04/03/2020: Glucose 99, BUN/Cr 14/0.8. EGFR 51.  HbA1C 6.8% Chol 115, TG 108, HDL 44, LDL 52 TSH 2.0normal  11/24/2017:  H/H 10.8/32.7. Platelets 170.   Review of Systems  Cardiovascular: Negative for chest pain, dyspnea on exertion, leg swelling, palpitations and syncope.         Vitals:   04/13/20 1420  BP: 111/68  Pulse: 67  Resp: 17  SpO2: 94%     Body mass index is 31.28 kg/m. Filed Weights   04/13/20 1420  Weight: 188 lb (85.3 kg)     Objective:   Physical Exam  Constitutional: No distress.  Neck: No  JVD present.  Cardiovascular: Normal rate, regular rhythm, normal heart sounds and intact distal pulses.  No murmur heard. Pulmonary/Chest: Effort normal and breath sounds normal. She has no wheezes. She has no rales.  Musculoskeletal:        General: No edema.  Nursing note and vitals reviewed.         Assessment & Recommendations:   74 year old African-American female with hypertension, type 2 diabetes mellitus, CAD s/p CABG x4 (LIMA-LAD, SVG-diagonal, sequential radial artery graft-PDA/PLA), h/o stroke, schizophrenia.  CAD: S/p CABG.  New symptoms concerning for angina. Physical activity is limited. Will obtain echocardiogram and pharmacological nuclear stress test. Currently on aspirin, crestor 40 mg, losartan-HCTZ 100-25 mg, metoprolol tartarate 25 mg bid.  Hypertension: Controlled.  H/o stroke: Currently asymptomatic. On Aspirin 81 mg daily-not on Aggrenox as previously recommended by neurology.  I have encouraged her to talk to his PCP regarding establishing care with a neurologist, as necessary.  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  I will obtain a carotid/vertebral artery ultrasound to evaluate this.  Type 2 DM: Continue follow up with PCP.  Hallucination: H/o schizophrenia. She has had recent hallucinations. Strongly recommend following up with PCP and probably seeing a psychiatrist.  F/u w/me in 4 weeks  Tannie Koskela Esther Hardy, MD [facility] Cardiovascular. [place] Pager: [phone] Office: [phone] If no answer Cell [phone]

## 2020-04-13 ENCOUNTER — Ambulatory Visit: Payer: HMO | Admitting: Cardiology

## 2020-04-13 ENCOUNTER — Encounter: Payer: Self-pay | Admitting: Cardiology

## 2020-04-13 ENCOUNTER — Other Ambulatory Visit: Payer: Self-pay

## 2020-04-13 VITALS — BP 111/68 | HR 67 | Resp 17 | Ht 65.0 in | Wt 188.0 lb

## 2020-04-13 DIAGNOSIS — Z8673 Personal history of transient ischemic attack (TIA), and cerebral infarction without residual deficits: Secondary | ICD-10-CM | POA: Diagnosis not present

## 2020-04-13 DIAGNOSIS — I10 Essential (primary) hypertension: Secondary | ICD-10-CM | POA: Diagnosis not present

## 2020-04-13 DIAGNOSIS — I25118 Atherosclerotic heart disease of native coronary artery with other forms of angina pectoris: Secondary | ICD-10-CM

## 2020-04-13 DIAGNOSIS — R443 Hallucinations, unspecified: Secondary | ICD-10-CM | POA: Insufficient documentation

## 2020-04-14 ENCOUNTER — Other Ambulatory Visit: Payer: HMO

## 2020-04-18 ENCOUNTER — Other Ambulatory Visit: Payer: Self-pay | Admitting: Cardiology

## 2020-04-20 DIAGNOSIS — I10 Essential (primary) hypertension: Secondary | ICD-10-CM | POA: Diagnosis not present

## 2020-04-20 DIAGNOSIS — E1100 Type 2 diabetes mellitus with hyperosmolarity without nonketotic hyperglycemic-hyperosmolar coma (NKHHC): Secondary | ICD-10-CM | POA: Diagnosis not present

## 2020-04-20 DIAGNOSIS — E785 Hyperlipidemia, unspecified: Secondary | ICD-10-CM | POA: Diagnosis not present

## 2020-04-21 ENCOUNTER — Telehealth: Payer: Self-pay

## 2020-04-21 NOTE — Telephone Encounter (Signed)
Called patient to r/s May 25th appoinment unable to leave voicemail

## 2020-04-30 ENCOUNTER — Telehealth: Payer: Self-pay

## 2020-05-04 ENCOUNTER — Other Ambulatory Visit: Payer: HMO

## 2020-05-20 ENCOUNTER — Ambulatory Visit: Payer: HMO | Admitting: Cardiology

## 2020-05-20 DIAGNOSIS — E1100 Type 2 diabetes mellitus with hyperosmolarity without nonketotic hyperglycemic-hyperosmolar coma (NKHHC): Secondary | ICD-10-CM | POA: Diagnosis not present

## 2020-05-20 DIAGNOSIS — E785 Hyperlipidemia, unspecified: Secondary | ICD-10-CM | POA: Diagnosis not present

## 2020-05-20 DIAGNOSIS — I10 Essential (primary) hypertension: Secondary | ICD-10-CM | POA: Diagnosis not present

## 2020-05-20 DIAGNOSIS — M150 Primary generalized (osteo)arthritis: Secondary | ICD-10-CM | POA: Diagnosis not present

## 2020-05-21 ENCOUNTER — Other Ambulatory Visit: Payer: Self-pay

## 2020-05-21 ENCOUNTER — Ambulatory Visit: Payer: HMO

## 2020-05-21 DIAGNOSIS — I25118 Atherosclerotic heart disease of native coronary artery with other forms of angina pectoris: Secondary | ICD-10-CM

## 2020-05-22 DIAGNOSIS — E1169 Type 2 diabetes mellitus with other specified complication: Secondary | ICD-10-CM | POA: Diagnosis not present

## 2020-05-22 DIAGNOSIS — I10 Essential (primary) hypertension: Secondary | ICD-10-CM | POA: Diagnosis not present

## 2020-05-22 DIAGNOSIS — E6609 Other obesity due to excess calories: Secondary | ICD-10-CM | POA: Diagnosis not present

## 2020-05-22 DIAGNOSIS — E785 Hyperlipidemia, unspecified: Secondary | ICD-10-CM | POA: Diagnosis not present

## 2020-05-22 DIAGNOSIS — M130 Polyarthritis, unspecified: Secondary | ICD-10-CM | POA: Diagnosis not present

## 2020-05-22 DIAGNOSIS — E782 Mixed hyperlipidemia: Secondary | ICD-10-CM | POA: Diagnosis not present

## 2020-05-27 ENCOUNTER — Telehealth: Payer: Self-pay

## 2020-05-27 NOTE — Telephone Encounter (Signed)
Dr. Burt Ek called and stated that patient told her she was advised to take lisinopril-HCTZ "as needed, but after to talking to Dr. Virgina Jock, he stated that patient needs to take it daily, not "as needed".

## 2020-06-02 IMAGING — MG DIGITAL SCREENING BILATERAL MAMMOGRAM WITH TOMO AND CAD
6 of 10 series · 6 of 30 positions shown · non-contrast
Comparison: Previous exam(s).

CLINICAL DATA: Screening.

EXAM:
DIGITAL SCREENING BILATERAL MAMMOGRAM WITH TOMO AND CAD

[R MLO synth-2D]
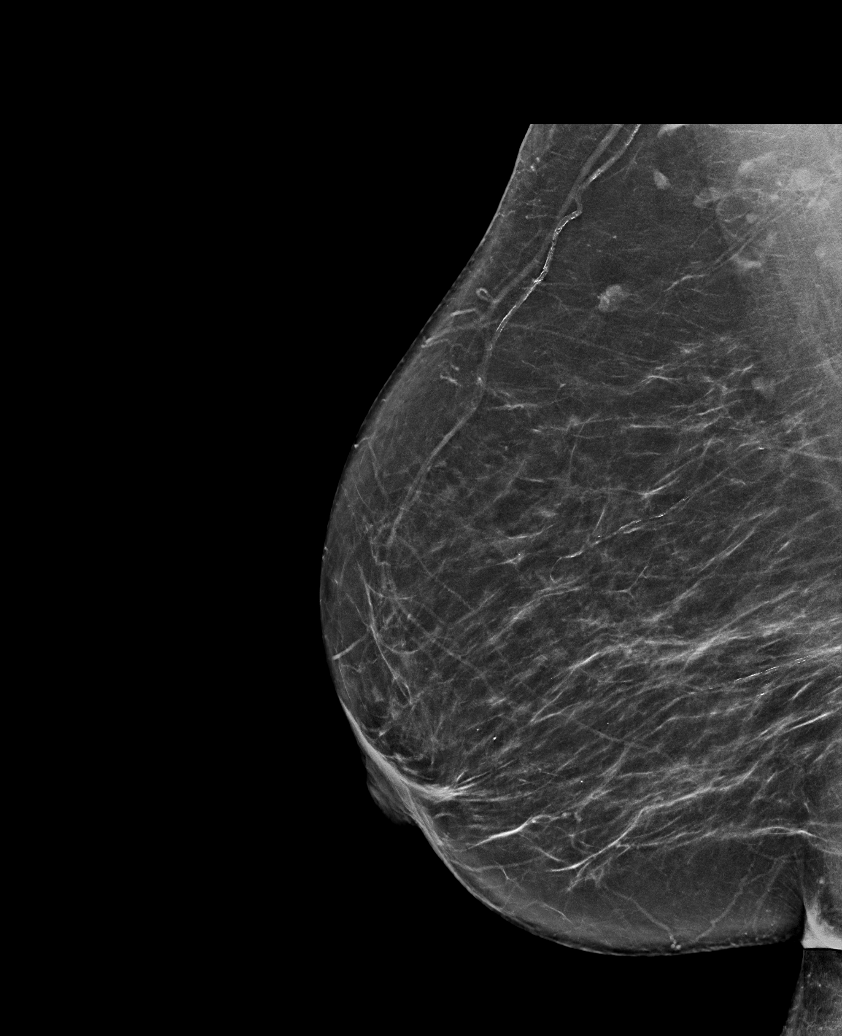

[R CC synth-2D]
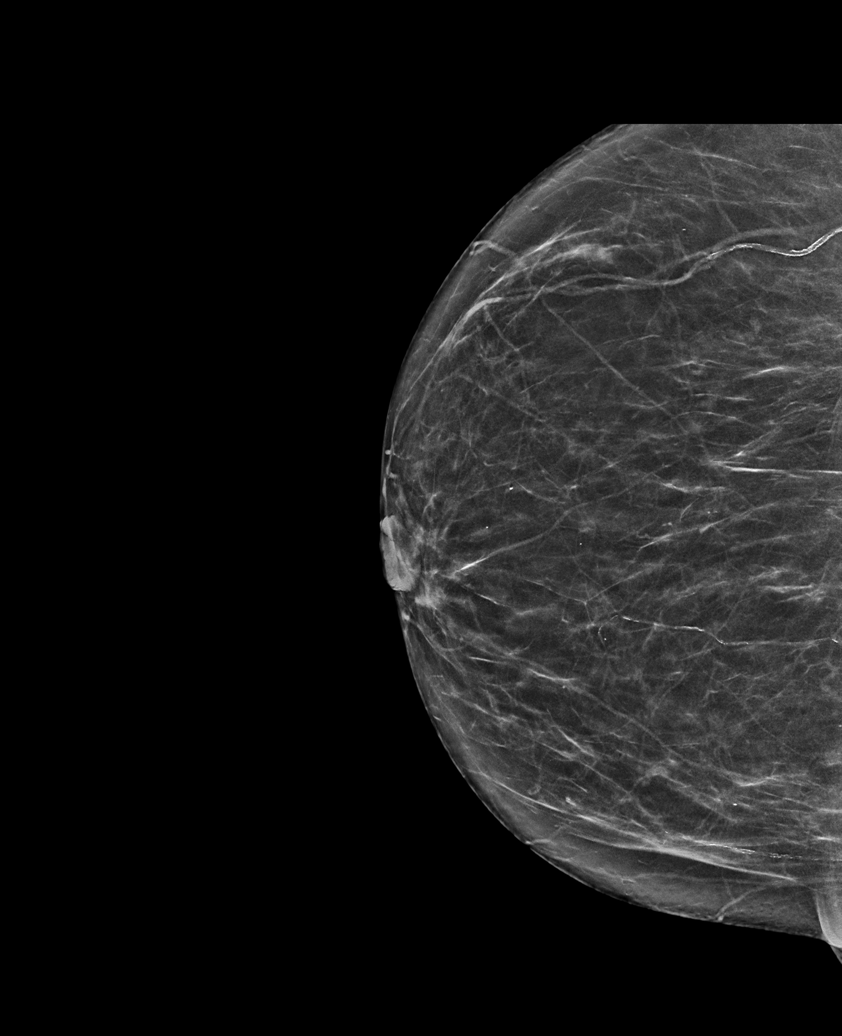

[L CC synth-2D]
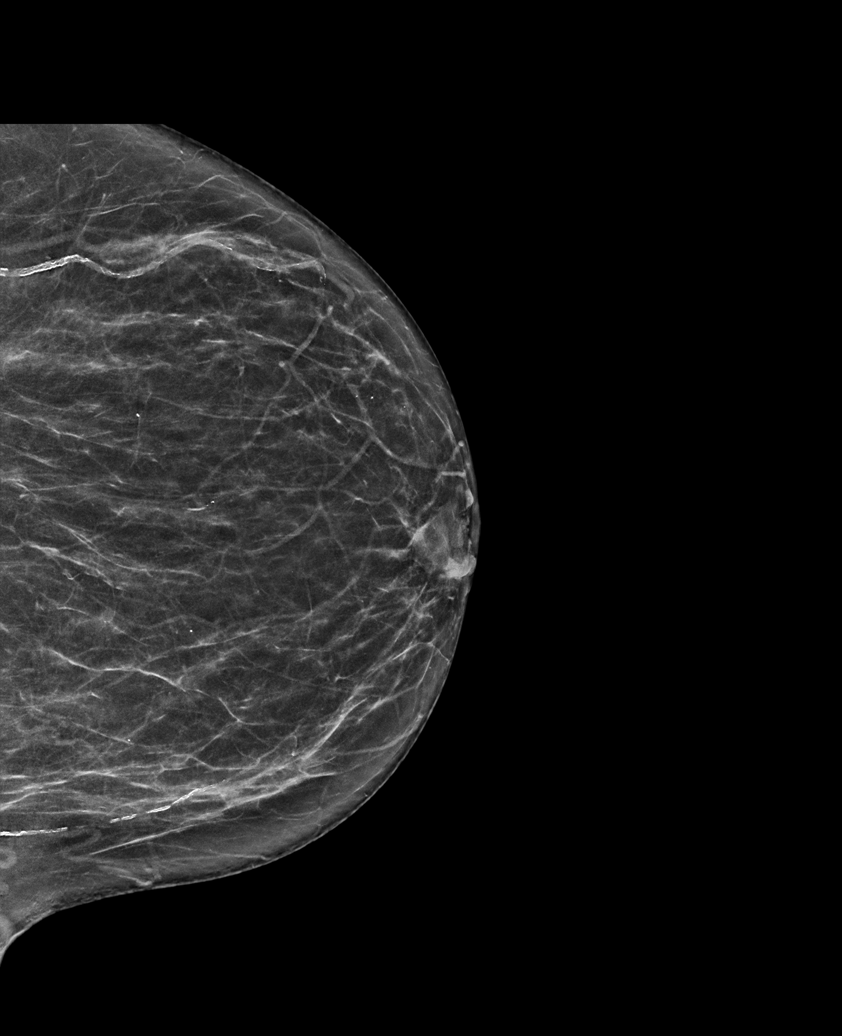

[L MLO synth-2D (1 of 2)]
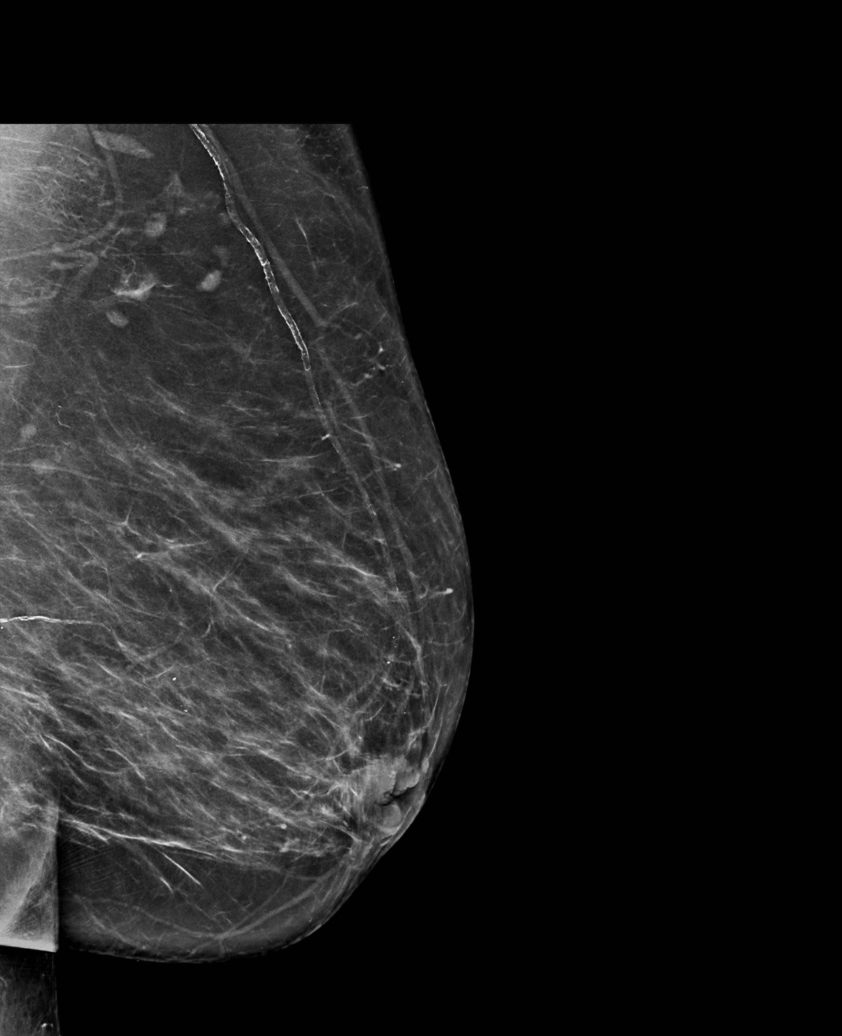

[L MLO synth-2D (2 of 2)]
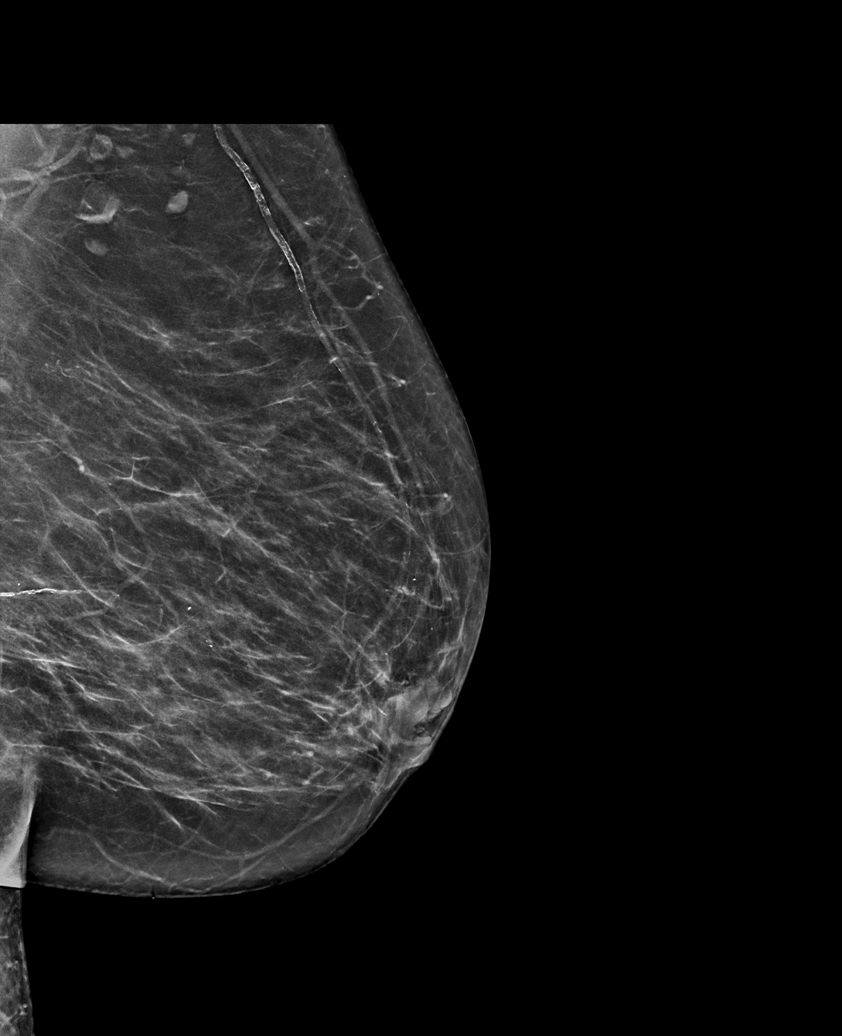

[L MLO tomo · tomo slice 41/81.0]
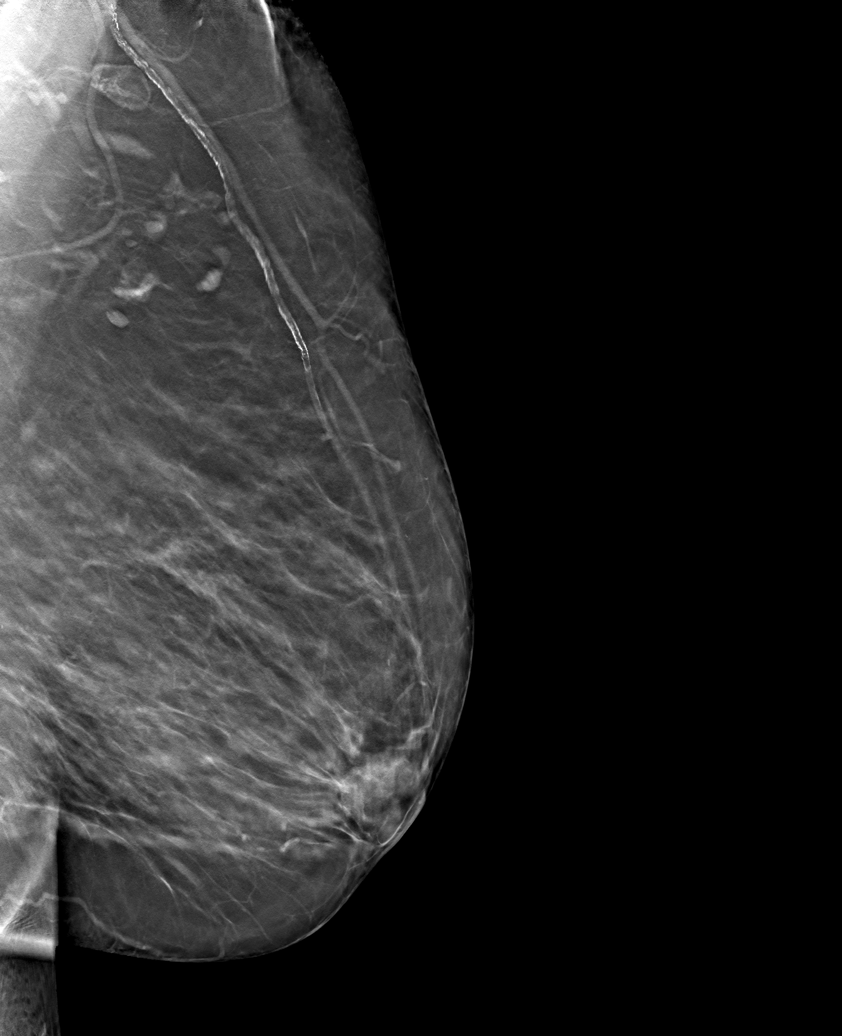

[6 of 30 positions shown; findings below may reference images not displayed]

ACR Breast Density Category b: There are scattered areas of
fibroglandular density.
FINDINGS: There are no findings suspicious for malignancy. Images were
processed with CAD.
IMPRESSION: No mammographic evidence of malignancy. A result letter of this
screening mammogram will be mailed directly to the patient.

RECOMMENDATION:
Screening mammogram in one year. (Code:[id number])

BI-RADS CATEGORY  1: Negative.

## 2020-06-08 ENCOUNTER — Ambulatory Visit: Payer: HMO

## 2020-06-08 ENCOUNTER — Other Ambulatory Visit: Payer: Self-pay

## 2020-06-08 DIAGNOSIS — I25118 Atherosclerotic heart disease of native coronary artery with other forms of angina pectoris: Secondary | ICD-10-CM | POA: Diagnosis not present

## 2020-06-09 NOTE — Progress Notes (Signed)
Discussed echocardiogram stress test findings with the patient.  She has not had any recurrent chest pain symptoms at this time.  Continue aggressive medical management.  Keep follow-up in August.  If she has any recurrent chest pain, recommend heart catheterization.  Nigel Mormon, MD [facility] Cardiovascular. [place] Office: [phone]

## 2020-06-16 ENCOUNTER — Other Ambulatory Visit: Payer: Self-pay

## 2020-06-19 DIAGNOSIS — M150 Primary generalized (osteo)arthritis: Secondary | ICD-10-CM | POA: Diagnosis not present

## 2020-06-19 DIAGNOSIS — E1100 Type 2 diabetes mellitus with hyperosmolarity without nonketotic hyperglycemic-hyperosmolar coma (NKHHC): Secondary | ICD-10-CM | POA: Diagnosis not present

## 2020-06-19 DIAGNOSIS — I10 Essential (primary) hypertension: Secondary | ICD-10-CM | POA: Diagnosis not present

## 2020-06-19 DIAGNOSIS — E785 Hyperlipidemia, unspecified: Secondary | ICD-10-CM | POA: Diagnosis not present

## 2020-06-22 ENCOUNTER — Other Ambulatory Visit: Payer: Self-pay | Admitting: Cardiology

## 2020-06-24 ENCOUNTER — Ambulatory Visit: Payer: HMO | Admitting: Cardiology

## 2020-06-29 ENCOUNTER — Other Ambulatory Visit: Payer: Self-pay

## 2020-06-29 NOTE — Patient Outreach (Signed)
  [facility]) Care Management Chronic Special Needs Program    06/29/2020  Name: Nicole Lloyd, DOB: 02-26-1946  MRN: [id number]   Ms. [name] is enrolled in a chronic special needs plan for Diabetes. RNCM called to follow up and review individualized care plan. No answer. Unable to leave message.   Plan: Chronic care management coordinator will attempt outreach within 1-2 weeks.  Thea Silversmith, RN, MSN, [place] [phone]

## 2020-06-30 ENCOUNTER — Other Ambulatory Visit: Payer: Self-pay

## 2020-06-30 NOTE — Patient Outreach (Signed)
  [place] [facility]) Care Management Chronic Special Needs Program    06/30/2020  Name: ADRINNE SZE, DOB: Sep 20, 1946  MRN: [id number]   Ms. Rane Dumm is enrolled in a chronic special needs plan for Diabetes. RNCM called to follow up and review individualized care plan. No answer. Unable to leave message.    Plan: Chronic care management coordinator will attempt outreach within 1-2 weeks.  Thea Silversmith, RN, MSN, [place] [phone]

## 2020-07-02 ENCOUNTER — Other Ambulatory Visit: Payer: Self-pay

## 2020-07-02 NOTE — Patient Outreach (Signed)
[place] [facility]) Care Management Chronic Special Needs Program  07/02/2020  Name: Nicole Lloyd DOB: [birth year]  MRN: [id number]  [name] is enrolled in a chronic special needs plan for Diabetes. RNCM called to follow up and update individualized care plan. No answer. Unable to leave message. 3rd outreach attempt. Care plan updated based upon available data.   Goals Addressed            This Visit's Progress   . Client verbalize knowledge of Heart Failure disease self management skills within the next 9-12 months.   On track    Per chart next visit with cardiologist 07/03/2020  Heart failure self-management actions: Know signs and symptoms of congestive heart failure exacerbation. Know when to call the doctor to who to call. Verbalize how fluid intake can affect congestive heart failure. Eat healthy and monitor salt intake. Visit your primary care or cardiologist as scheduled. Know the importance of daily weights.     . Client verbalizes knowledge of Heart Attack self management skills within 6-9 months.   On track    Self management skills: Attend provider visits as scheduled. Per records next appointment with cardiologist 07/03/2020 Please call your cardiologist and schedule a visit if you do not have one scheduled. Take your medications as prescribed. Signs/symptoms: chest discomfort; shortness of breath; breaking out in cold sweat, nausea or lightheadedness Call 911 for symptoms     . Client will not report change from baseline and no repeated symptoms of stroke with in the next the next 6-9 months.   On track    Please follow up with your providers visits as scheduled.   Stroke Symptoms: Spot a stroke F.A.S.T. FACE DROOPING Does one side of the face droop or is it numb? Ask the person to smile. ARM WEAKNESS Is one arm weak or numb? Ask the person to raise both arms. Does one arm drift downward? SPEECH DIFFICULTY Is speech slurred, are they  unable to speak, or are they hard to understand? Ask the person to repeat a simple sentence, like "the sky is blue." Is the sentence repeated correctly? TIME TO CALL 9-1-1 If the person shows any of these symptoms, even if the symptoms go away, call 9-1-1 and get them to the hospital immediately.    . Client will report no worsening of symptoms related to heart disease within the next within the next 6-9 months.   On track    Attend provider visits as scheduled. Please call your cardiologist and schedule a visit if you do not have one scheduled. Please take your medications as scheduled.     . COMPLETED: HEMOGLOBIN A1C < 7       Last noted A1C 6.8 on 04/03/2020 Great job!.   Diabetes self management actions:  Glucose monitoring per provider recommendations  Eat Healthy  Check feet daily  Visit provider every 3-6 months as directed  Hbg A1C level every 3-6 months.  Eye Exam yearly  Ask your doctor, "what is my Target A1C goal?"  Ask your doctor, "what is my blood sugar range?"    . COMPLETED: Maintain timely refills of diabetic medication as prescribed within the year .       Maintaining timely refill medications per dispense report.    . COMPLETED: Obtain annual  Lipid Profile, LDL-C       Per chart done 04/03/2020 It is important to attend scheduled visits with your doctor for yearly exam and recommended labs/procedures.    Marland Kitchen  COMPLETED: Obtain Annual Eye (retinal)  Exam        Done 01/03/2020    . Obtain Annual Foot Exam   On track    Diabetes can affect the nerves in your feet, causing decreased feeling or numbness. It is important to attend scheduled visits with your doctor for yearly exam.     . Obtain annual screen for micro albuminuria (urine) , nephropathy (kidney problems)   On track    Diabetes can affect your kidneys. This test shows how your kidneys are working.     . Obtain Hemoglobin A1C at least 2 times per year   On track    A1C 6.8 on 04/03/2020 It is  important to attend scheduled visits with your doctor for yearly exam and recommended labs/procedures.    . Visit Primary Care Provider or Endocrinologist at least 2 times per year    On track    Per [facility], last documented provider visit was on 04/13/2020 It is important to attend scheduled visits with your doctor for yearly exam and recommended labs/procedures.        Plan: send updated care plan to client; send updated care plan to primary care. Next outreach per tier level within 6 months or sooner as indicated.    Thea Silversmith, RN, MSN, [place] [phone]

## 2020-07-03 ENCOUNTER — Other Ambulatory Visit: Payer: Self-pay

## 2020-07-03 ENCOUNTER — Encounter: Payer: Self-pay | Admitting: Cardiology

## 2020-07-03 ENCOUNTER — Ambulatory Visit: Payer: HMO | Admitting: Cardiology

## 2020-07-03 VITALS — BP 106/68 | HR 64 | Resp 17 | Ht 65.0 in | Wt 187.0 lb

## 2020-07-03 DIAGNOSIS — Z8673 Personal history of transient ischemic attack (TIA), and cerebral infarction without residual deficits: Secondary | ICD-10-CM

## 2020-07-03 DIAGNOSIS — I10 Essential (primary) hypertension: Secondary | ICD-10-CM

## 2020-07-03 DIAGNOSIS — I25118 Atherosclerotic heart disease of native coronary artery with other forms of angina pectoris: Secondary | ICD-10-CM

## 2020-07-03 DIAGNOSIS — I6522 Occlusion and stenosis of left carotid artery: Secondary | ICD-10-CM | POA: Diagnosis not present

## 2020-07-03 DIAGNOSIS — R9439 Abnormal result of other cardiovascular function study: Secondary | ICD-10-CM

## 2020-07-03 MED ORDER — NITROGLYCERIN 0.4 MG SL SUBL: 0.4000 mg | SUBLINGUAL_TABLET | SUBLINGUAL | 2 refills | Status: DC | PRN

## 2020-07-03 NOTE — Progress Notes (Signed)
Follow up visit  Subjective:   Nicole Lloyd, female    DOB: 1946-01-16, 74 y.o.   MRN: [id number]   Chief Complaint  Patient presents with  . Coronary Artery Disease  . Follow-up    4 week     HPI  74 year old African-American female with hypertension, type 2 diabetes mellitus, CAD s/p CABG x4 (LIMA-LAD, SVG-diagonal, sequential radial artery graft-PDA/PLA) in 2000, h/o stroke, schizophrenia.  Patient recently underwent stress test due to concern for angina. Stress test showed Small sized, mild intensity, partially reversible perfusion defect in inferior/inferolateral myocardium, along with inferolateral ST depressions. Stress LVEF 64%.   Patient continues to have episodes of chest and jaw pressure, describes as "smothering and like her heart is coming out of her body".   Current Outpatient Medications on File Prior to Visit  Medication Sig Dispense Refill  . aspirin 81 MG tablet Take 81 mg by mouth daily.    . calcium carbonate (TUMS - DOSED IN MG ELEMENTAL CALCIUM) 500 MG chewable tablet Chew 1 tablet by mouth daily.    Marland Kitchen glimepiride (AMARYL) 1 MG tablet Take 1 mg by mouth daily with breakfast.    . losartan-hydrochlorothiazide (HYZAAR) 100-25 MG tablet TAKE ONE TABLET BY MOUTH ONCE DAILY AS NEEDED 90 tablet 1  . metFORMIN (GLUCOPHAGE) 1000 MG tablet Take 500 mg by mouth 2 (two) times daily with a meal.     . metoprolol tartrate (LOPRESSOR) 50 MG tablet TAKE 1/2 TABLET BY MOUTH TWICE DAILY 60 tablet 3   No current facility-administered medications on file prior to visit.    Cardiovascular studies:  Exercise Myoview stress test 06/08/2020: Exercise nuclear stress test was performed using Bruce protocol. Patient reached 7 METS, and 95% of age predicted maximum heart rate. Exercise capacity was low. Chest pain not reported. Heart rate and hemodynamic response were normal. Stress EKG demonstrated sinus tachycardia, 1 mm horizontal ST depression leads V4-V6, T wave inversion  lads II, III, aVF, normalize 2 min into recovery.  Small sized, mild intensity, partially reversible perfusion defect in inferior/inferolateral myocardium. Stress LVEF 64%. Intermediate risk study.   Echocardiogram 05/21/2020:  Normal LV systolic function with EF 59%. Left ventricle cavity is normal  in size. Normal global wall motion. Doppler evidence of grade I (impaired)  diastolic dysfunction, normal LAP. Calculated EF 59%.  Left atrial cavity is normal in size. Interatrial septum bulges to the  left suggestive of elevated right atrial pressure.  Structurally normal mitral valve. Mild (Grade I) mitral regurgitation.  Structurally normal tricuspid valve. Moderate tricuspid regurgitation.  Mild pulmonary hypertension. RVSP measures 34 mmHg.  EKG 04/13/2020: Sinus rhythm 66 bpm Nonspecific T-abnormality  Carotid artery duplex 03/00/9233:  Peak systolic velocities in the right bifurcation, internal, external and  common carotid arteries are within normal limits.  Doppler flow velocities in the left internal carotid artery are consistent  with stenosis in the range of 16-49% with moderate heterogeneous plaque.  Antegrade right vertebral artery flow. Antegrade left vertebral artery  flow.  Follow up in one year is appropriate if clinically indicated.     Recent labs: 04/03/2020: Glucose 99, BUN/Cr 14/0.8. EGFR 51.  HbA1C 6.8% Chol 115, TG 108, HDL 44, LDL 52 TSH 2.0normal  11/24/2017: H/H 10.8/32.7. Platelets 170.   Review of Systems  Cardiovascular: Negative for chest pain, dyspnea on exertion, leg swelling, palpitations and syncope.         Vitals:   07/03/20 1012  BP: 106/68  Pulse: 64  Resp: 17  SpO2: 96%     Body mass index is 31.12 kg/m. Filed Weights   07/03/20 1012  Weight: 187 lb (84.8 kg)     Objective:   Physical Exam Vitals and nursing note reviewed.  Constitutional:      General: She is not in acute distress. Neck:     Vascular: No JVD.   Cardiovascular:     Rate and Rhythm: Normal rate and regular rhythm.     Pulses: Intact distal pulses.     Heart sounds: Normal heart sounds. No murmur heard.   Pulmonary:     Effort: Pulmonary effort is normal.     Breath sounds: Normal breath sounds. No wheezing or rales.           Assessment & Recommendations:   74 year old African-American female with hypertension, type 2 diabetes mellitus, CAD s/p CABG x4 (LIMA-LAD, SVG-diagonal, sequential radial artery graft-PDA/PLA) in 2000 with ongoing angina, abnormal stress test, h/o stroke, schizophrenia.   CAD: S/p CABG.  New symptoms concerning for angina. Stress test showed Small sized, mild intensity, partially reversible perfusion defect in inferior/inferolateral myocardium, along with inferolateral ST depressions. Stress LVEF 64%. Currently on aspirin, crestor 40 mg, losartan-HCTZ 100-25 mg, metoprolol tartarate 25 mg bid. In view of her symptoms, and inability to uptitrate medical management due to low normal blood pressure, recommend coronary and bypass graft angiography with possible intervention.  Schedule for cardiac catheterization, and possible intervention. We discussed regarding risks, benefits, alternatives to this including stress testing, CTA and continued medical therapy. Patient wants to proceed. Understands <1-2% risk of death, stroke, MI, urgent CABG, bleeding, infection, renal failure but not limited to these.  Hypertension: Controlled.  H/o stroke: Currently asymptomatic. On Aspirin 81 mg daily-not on Aggrenox as previously recommended by neurology.  I have encouraged her to talk to his PCP regarding establishing care with a neurologist, as necessary.  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  I will obtain a carotid/vertebral artery ultrasound to evaluate this.  Type 2 DM: Continue follow up with PCP.  Hallucination: H/o schizophrenia. She has had recent hallucinations. Strongly  recommend following up with PCP and probably seeing a psychiatrist.  F/u w/me in 4 weeks  Danila Eddie Esther Hardy, MD [facility] Cardiovascular. [place] Pager: [phone] Office: [phone] If no answer Cell [phone]

## 2020-07-10 ENCOUNTER — Other Ambulatory Visit (HOSPITAL_COMMUNITY)
Admission: RE | Admit: 2020-07-10 | Discharge: 2020-07-10 | Disposition: A | Discharge status: Home / Self Care | Payer: HMO | Source: Ambulatory Visit | Attending: Cardiology | Admitting: Cardiology

## 2020-07-10 ENCOUNTER — Other Ambulatory Visit (HOSPITAL_COMMUNITY): Payer: Self-pay

## 2020-07-10 DIAGNOSIS — Z01812 Encounter for preprocedural laboratory examination: Secondary | ICD-10-CM | POA: Insufficient documentation

## 2020-07-10 DIAGNOSIS — Z20822 Contact with and (suspected) exposure to covid-19: Secondary | ICD-10-CM | POA: Diagnosis not present

## 2020-07-10 LAB — SARS CORONAVIRUS 2 (TAT 6-24 HRS): SARS Coronavirus 2: NEGATIVE

## 2020-07-14 ENCOUNTER — Encounter (HOSPITAL_COMMUNITY): Payer: Self-pay | Admitting: Cardiology

## 2020-07-14 ENCOUNTER — Other Ambulatory Visit: Payer: Self-pay

## 2020-07-14 ENCOUNTER — Encounter (HOSPITAL_COMMUNITY)
Admission: RE | Disposition: A | Discharge status: Home / Self Care | Payer: Self-pay | Source: Home / Self Care | Attending: Cardiology

## 2020-07-14 ENCOUNTER — Ambulatory Visit (HOSPITAL_COMMUNITY)
Admission: RE | Admit: 2020-07-14 | Discharge: 2020-07-14 | Disposition: A | Discharge status: Home / Self Care | Payer: HMO | Attending: Cardiology | Admitting: Cardiology

## 2020-07-14 DIAGNOSIS — I10 Essential (primary) hypertension: Secondary | ICD-10-CM | POA: Insufficient documentation

## 2020-07-14 DIAGNOSIS — I25118 Atherosclerotic heart disease of native coronary artery with other forms of angina pectoris: Secondary | ICD-10-CM | POA: Diagnosis present

## 2020-07-14 DIAGNOSIS — R9439 Abnormal result of other cardiovascular function study: Secondary | ICD-10-CM | POA: Diagnosis present

## 2020-07-14 DIAGNOSIS — Z7982 Long term (current) use of aspirin: Secondary | ICD-10-CM | POA: Insufficient documentation

## 2020-07-14 DIAGNOSIS — Z951 Presence of aortocoronary bypass graft: Secondary | ICD-10-CM | POA: Insufficient documentation

## 2020-07-14 DIAGNOSIS — Z8673 Personal history of transient ischemic attack (TIA), and cerebral infarction without residual deficits: Secondary | ICD-10-CM | POA: Insufficient documentation

## 2020-07-14 DIAGNOSIS — I25119 Atherosclerotic heart disease of native coronary artery with unspecified angina pectoris: Secondary | ICD-10-CM | POA: Insufficient documentation

## 2020-07-14 DIAGNOSIS — F209 Schizophrenia, unspecified: Secondary | ICD-10-CM | POA: Diagnosis not present

## 2020-07-14 DIAGNOSIS — I2582 Chronic total occlusion of coronary artery: Secondary | ICD-10-CM | POA: Diagnosis not present

## 2020-07-14 DIAGNOSIS — Z79899 Other long term (current) drug therapy: Secondary | ICD-10-CM | POA: Insufficient documentation

## 2020-07-14 DIAGNOSIS — E119 Type 2 diabetes mellitus without complications: Secondary | ICD-10-CM | POA: Insufficient documentation

## 2020-07-14 DIAGNOSIS — Z7984 Long term (current) use of oral hypoglycemic drugs: Secondary | ICD-10-CM | POA: Insufficient documentation

## 2020-07-14 HISTORY — PX: LEFT HEART CATH AND CORS/GRAFTS ANGIOGRAPHY: CATH118250

## 2020-07-14 LAB — CBC
HCT: 38.9 % (ref 36.0–46.0)
Hemoglobin: 11.9 g/dL — ABNORMAL LOW (ref 12.0–15.0)
MCH: 28.5 pg (ref 26.0–34.0)
MCHC: 30.6 g/dL (ref 30.0–36.0)
MCV: 93.3 fL (ref 80.0–100.0)
Platelets: 170 10*3/uL (ref 150–400)
RBC: 4.17 MIL/uL (ref 3.87–5.11)
RDW: 13.4 % (ref 11.5–15.5)
WBC: 6.8 10*3/uL (ref 4.0–10.5)
nRBC: 0 % (ref 0.0–0.2)

## 2020-07-14 LAB — GLUCOSE, CAPILLARY
Glucose-Capillary: 105 mg/dL — ABNORMAL HIGH (ref 70–99)
Glucose-Capillary: 104 mg/dL — ABNORMAL HIGH (ref 70–99)

## 2020-07-14 LAB — BASIC METABOLIC PANEL
Anion gap: 9 (ref 5–15)
BUN: 10 mg/dL (ref 8–23)
CO2: 26 mmol/L (ref 22–32)
Calcium: 9.5 mg/dL (ref 8.9–10.3)
Chloride: 105 mmol/L (ref 98–111)
Creatinine, Ser: 1.13 mg/dL — ABNORMAL HIGH (ref 0.44–1.00)
GFR calc Af Amer: 55 mL/min — ABNORMAL LOW (ref >60)
GFR calc non Af Amer: 48 mL/min — ABNORMAL LOW (ref >60)
Glucose, Bld: 116 mg/dL — ABNORMAL HIGH (ref 70–99)
Potassium: 4.4 mmol/L (ref 3.5–5.1)
Sodium: 140 mmol/L (ref 135–145)

## 2020-07-14 SURGERY — LEFT HEART CATH AND CORS/GRAFTS ANGIOGRAPHY
Anesthesia: LOCAL

## 2020-07-14 MED ORDER — MIDAZOLAM HCL 2 MG/2ML IJ SOLN
INTRAMUSCULAR | Status: DC | PRN
  Administered 2020-07-14: 1 mg via INTRAVENOUS

## 2020-07-14 MED ORDER — LABETALOL HCL 5 MG/ML IV SOLN: 10.0000 mg | INTRAVENOUS | Status: DC | PRN

## 2020-07-14 MED ORDER — HEPARIN (PORCINE) IN NACL 1000-0.9 UT/500ML-% IV SOLN
INTRAVENOUS | Status: AC
  Filled 2020-07-14: qty 1000

## 2020-07-14 MED ORDER — ASPIRIN 81 MG PO CHEW
CHEWABLE_TABLET | ORAL | Status: AC
  Filled 2020-07-14: qty 1

## 2020-07-14 MED ORDER — SODIUM CHLORIDE 0.9% FLUSH: 3.0000 mL | INTRAVENOUS | Status: DC | PRN

## 2020-07-14 MED ORDER — LIDOCAINE HCL (PF) 1 % IJ SOLN
INTRAMUSCULAR | Status: AC
  Filled 2020-07-14: qty 30

## 2020-07-14 MED ORDER — MIDAZOLAM HCL 2 MG/2ML IJ SOLN
INTRAMUSCULAR | Status: AC
  Filled 2020-07-14: qty 2

## 2020-07-14 MED ORDER — HYDRALAZINE HCL 20 MG/ML IJ SOLN: 10.0000 mg | INTRAMUSCULAR | Status: DC | PRN

## 2020-07-14 MED ORDER — FENTANYL CITRATE (PF) 100 MCG/2ML IJ SOLN
INTRAMUSCULAR | Status: AC
  Filled 2020-07-14: qty 2

## 2020-07-14 MED ORDER — LIDOCAINE HCL (PF) 1 % IJ SOLN
INTRAMUSCULAR | Status: DC | PRN
  Administered 2020-07-14: 5 mL

## 2020-07-14 MED ORDER — ONDANSETRON HCL 4 MG/2ML IJ SOLN: 4.0000 mg | Freq: Four times a day (QID) | INTRAMUSCULAR | Status: DC | PRN

## 2020-07-14 MED ORDER — ACETAMINOPHEN 325 MG PO TABS: 650.0000 mg | ORAL_TABLET | ORAL | Status: DC | PRN

## 2020-07-14 MED ORDER — SODIUM CHLORIDE 0.9 % WEIGHT BASED INFUSION
3.0000 mL/kg/h | INTRAVENOUS | Status: AC
  Administered 2020-07-14: 3 mL/kg/h via INTRAVENOUS

## 2020-07-14 MED ORDER — FENTANYL CITRATE (PF) 100 MCG/2ML IJ SOLN
INTRAMUSCULAR | Status: DC | PRN
Start: 2020-07-14 — End: 2020-07-14
  Administered 2020-07-14: 50 ug via INTRAVENOUS

## 2020-07-14 MED ORDER — SODIUM CHLORIDE 0.9 % WEIGHT BASED INFUSION: 1.0000 mL/kg/h | INTRAVENOUS | Status: DC

## 2020-07-14 MED ORDER — SODIUM CHLORIDE 0.9 % IV SOLN: 250.0000 mL | INTRAVENOUS | Status: DC | PRN

## 2020-07-14 MED ORDER — ASPIRIN 81 MG PO CHEW
81.0000 mg | CHEWABLE_TABLET | ORAL | Status: AC
  Administered 2020-07-14: 81 mg via ORAL

## 2020-07-14 MED ORDER — SODIUM CHLORIDE 0.9% FLUSH: 3.0000 mL | Freq: Two times a day (BID) | INTRAVENOUS | Status: DC

## 2020-07-14 MED ORDER — SODIUM CHLORIDE 0.9 % IV SOLN: INTRAVENOUS | Status: AC

## 2020-07-14 MED ORDER — IOHEXOL 350 MG/ML SOLN
INTRAVENOUS | Status: AC
  Filled 2020-07-14: qty 1

## 2020-07-14 MED ORDER — HEPARIN (PORCINE) IN NACL 1000-0.9 UT/500ML-% IV SOLN
INTRAVENOUS | Status: DC | PRN
  Administered 2020-07-14 (×2): 500 mL

## 2020-07-14 MED ORDER — IOHEXOL 350 MG/ML SOLN
INTRAVENOUS | Status: DC | PRN
  Administered 2020-07-14: 75 mL

## 2020-07-14 SURGICAL SUPPLY — 14 items
CATH INFINITI 5 FR AR1 MOD (CATHETERS) ×1 IMPLANT
CATH INFINITI 5 FR IM (CATHETERS) ×1 IMPLANT
CATH INFINITI 5 FR RCB (CATHETERS) ×1 IMPLANT
CATH INFINITI 5FR AL1 (CATHETERS) ×1 IMPLANT
CATH INFINITI 5FR MULTPACK ANG (CATHETERS) ×1 IMPLANT
KIT HEART LEFT (KITS) ×2 IMPLANT
KIT MICROPUNCTURE NIT STIFF (SHEATH) ×1 IMPLANT
PACK CARDIAC CATHETERIZATION (CUSTOM PROCEDURE TRAY) ×2 IMPLANT
SHEATH PINNACLE 5F 10CM (SHEATH) ×1 IMPLANT
SHEATH PROBE COVER 6X72 (BAG) ×1 IMPLANT
TRANSDUCER W/STOPCOCK (MISCELLANEOUS) ×2 IMPLANT
TUBING CIL FLEX 10 FLL-RA (TUBING) ×2 IMPLANT
WIRE EMERALD 3MM-J .035X150CM (WIRE) ×1 IMPLANT
WIRE EMERALD 3MM-J .035X260CM (WIRE) ×1 IMPLANT

## 2020-07-14 NOTE — Interval H&P Note (Signed)
History and Physical Interval Note:  07/14/2020 7:37 AM  Nicole Lloyd  has presented today for surgery, with the diagnosis of cad.  The various methods of treatment have been discussed with the patient and family. After consideration of risks, benefits and other options for treatment, the patient has consented to  Procedure(s): LEFT HEART CATH AND CORS/GRAFTS ANGIOGRAPHY (N/A) as a surgical intervention.  The patient's history has been reviewed, patient examined, no change in status, stable for surgery.  I have reviewed the patient's chart and labs.  Questions were answered to the patient's satisfaction.    2016/2017 Appropriate Use Criteria for Coronary Revascularization Clinical Presentation: Diabetes Mellitus? Symptom Status? S/P CABG? Antianginal Therapy (# of long-acting drugs)? Results of Non-invasive testing? FFR/iFR results in all diseased vessels? Patient undergoing renal transplant? Patient undergoing percutaneous valve procedure (TAVR, MitraClip, Others)? Symptom Status:  Ischemic Symptoms  Non-invasive Testing:  Intermediate Risk  If no or indeterminate stress test, FFR/iFR results in all diseased vessels:  N/A  Diabetes Mellitus:  No  S/P CABG:  Yes  Antianginal therapy (number of long-acting drugs):  1  Patient undergoing renal transplant:  No  Patient undergoing percutaneous valve procedure:  No     LIMA-LAD patent and without significant stenoses PCI CABG  Stenosis supplying 1 territory (bypass graft or native artery) other than anterior A (7); Indication 76 M (4); Indication 31   Stenoses supplying 2 territories (bypass graft or native artery, either 2 separate vessels or sequential graft supplying 2 territories) not including anterior territory A (7); Indication 35 M (5); Indication 34   newline LIMA-LAD not patent  Stenosis supplying 1 territory (bypass graft or native artery)-anterior (LAD) territory A (7); Indication 15 M (5); Indication 36   Stenoses supplying  2 territories (bypass graft or native artery, either 2 separate vessels or sequential graft supplying 2 territories)-LAD plus other territory A (7); Indication 39 A (7); Indication 39   Stenoses supplying 3 territories (bypass graft or native arteries, separate vessels, sequential grafts, or combination thereof)-LAD plus 2 other territories A (7); Indication 41 A (7); Indication Combine

## 2020-07-14 NOTE — Progress Notes (Signed)
Site area: right groin  Site Prior to Removal:  Level 0  Pressure Applied For 15 MINUTES    Minutes Beginning at 0910  Manual:   Yes.    Patient Status During Pull:  Stable  Post Pull Groin Site:  Level 0  Post Pull Instructions Given:  Yes.    Post Pull Pulses Present:  Yes.    Dressing Applied:  Yes.    Comments:  Bed est started at [facility]

## 2020-07-14 NOTE — H&P (Signed)
OV 8/13 copied for documentation     Follow up visit  Subjective:   Nicole Lloyd, female    DOB: 1946/08/18, 74 y.o.   MRN: [id number]    HPI  74 year old African-American female with hypertension, type 2 diabetes mellitus, CAD s/p CABG x4 (LIMA-LAD, SVG-diagonal, sequential radial artery graft-PDA/PLA) in 2000, h/o stroke, schizophrenia.  Patient recently underwent stress test due to concern for angina. Stress test showed Small sized, mild intensity, partially reversible perfusion defect in inferior/inferolateral myocardium, along with inferolateral ST depressions. Stress LVEF 64%.   Patient continues to have episodes of chest and jaw pressure, describes as "smothering and like her heart is coming out of her body".   No current facility-administered medications on file prior to encounter.   Current Outpatient Medications on File Prior to Encounter  Medication Sig Dispense Refill  . aspirin EC 81 MG tablet Take 81 mg by mouth daily. Swallow whole.    . citalopram (CELEXA) 10 MG tablet Take 10 mg by mouth daily.    Marland Kitchen glimepiride (AMARYL) 1 MG tablet Take 1 mg by mouth daily with breakfast.    . metFORMIN (GLUCOPHAGE) 1000 MG tablet Take 1,000 mg by mouth at bedtime.     . metoprolol tartrate (LOPRESSOR) 50 MG tablet TAKE 1/2 TABLET BY MOUTH TWICE DAILY (Patient taking differently: Take 25 mg by mouth in the morning and at bedtime. ) 60 tablet 3  . rosuvastatin (CRESTOR) 40 MG tablet Take 40 mg by mouth every evening.    . calcium carbonate (TUMS - DOSED IN MG ELEMENTAL CALCIUM) 500 MG chewable tablet Chew 1-2 tablets by mouth 2 (two) times daily as needed for indigestion or heartburn.     . losartan-hydrochlorothiazide (HYZAAR) 100-25 MG tablet TAKE ONE TABLET BY MOUTH ONCE DAILY AS NEEDED (Patient not taking: Reported on 07/08/2020) 90 tablet 1  . nitroGLYCERIN (NITROSTAT) 0.4 MG SL tablet Place 1 tablet (0.4 mg total) under the tongue every 5 (five) minutes as needed for chest  pain. (Patient taking differently: Place 0.4 mg under the tongue every 5 (five) minutes x 3 doses as needed for chest pain. ) 30 tablet 2    Cardiovascular studies:  Exercise Myoview stress test 06/08/2020: Exercise nuclear stress test was performed using Bruce protocol. Patient reached 7 METS, and 95% of age predicted maximum heart rate. Exercise capacity was low. Chest pain not reported. Heart rate and hemodynamic response were normal. Stress EKG demonstrated sinus tachycardia, 1 mm horizontal ST depression leads V4-V6, T wave inversion lads II, III, aVF, normalize 2 min into recovery.  Small sized, mild intensity, partially reversible perfusion defect in inferior/inferolateral myocardium. Stress LVEF 64%. Intermediate risk study.   Echocardiogram 05/21/2020:  Normal LV systolic function with EF 59%. Left ventricle cavity is normal  in size. Normal global wall motion. Doppler evidence of grade I (impaired)  diastolic dysfunction, normal LAP. Calculated EF 59%.  Left atrial cavity is normal in size. Interatrial septum bulges to the  left suggestive of elevated right atrial pressure.  Structurally normal mitral valve. Mild (Grade I) mitral regurgitation.  Structurally normal tricuspid valve. Moderate tricuspid regurgitation.  Mild pulmonary hypertension. RVSP measures 34 mmHg.  EKG 04/13/2020: Sinus rhythm 66 bpm Nonspecific T-abnormality  Carotid artery duplex 99/24/2683:  Peak systolic velocities in the right bifurcation, internal, external and  common carotid arteries are within normal limits.  Doppler flow velocities in the left internal carotid artery are consistent  with stenosis in the range of 16-49% with moderate heterogeneous plaque.  Antegrade right vertebral artery flow. Antegrade left vertebral artery  flow.  Follow up in one year is appropriate if clinically indicated.     Recent labs: 04/03/2020: Glucose 99, BUN/Cr 14/0.8. EGFR 51.  HbA1C 6.8% Chol 115, TG 108,  HDL 44, LDL 52 TSH 2.0normal  11/24/2017: H/H 10.8/32.7. Platelets 170.   Review of Systems  Cardiovascular: Negative for chest pain, dyspnea on exertion, leg swelling, palpitations and syncope.         Vitals:   07/14/20 0554 07/14/20 0727  BP: 115/69   Pulse: 63   Resp: 16   Temp: 97.9 F (36.6 C)   SpO2: 100% 100%     Body mass index is 29.95 kg/m. Filed Weights   07/14/20 0554  Weight: 81.6 kg     Objective:   Physical Exam Vitals and nursing note reviewed.  Constitutional:      General: She is not in acute distress. Neck:     Vascular: No JVD.  Cardiovascular:     Rate and Rhythm: Normal rate and regular rhythm.     Pulses: Intact distal pulses.     Heart sounds: Normal heart sounds. No murmur heard.   Pulmonary:     Effort: Pulmonary effort is normal.     Breath sounds: Normal breath sounds. No wheezing or rales.           Assessment & Recommendations:   74 year old African-American female with hypertension, type 2 diabetes mellitus, CAD s/p CABG x4 (LIMA-LAD, SVG-diagonal, sequential radial artery graft-PDA/PLA) in 2000 with ongoing angina, abnormal stress test, h/o stroke, schizophrenia.   CAD: S/p CABG.  New symptoms concerning for angina. Stress test showed Small sized, mild intensity, partially reversible perfusion defect in inferior/inferolateral myocardium, along with inferolateral ST depressions. Stress LVEF 64%. Currently on aspirin, crestor 40 mg, losartan-HCTZ 100-25 mg, metoprolol tartarate 25 mg bid. In view of her symptoms, and inability to uptitrate medical management due to low normal blood pressure, recommend coronary and bypass graft angiography with possible intervention.  Schedule for cardiac catheterization, and possible intervention. We discussed regarding risks, benefits, alternatives to this including stress testing, CTA and continued medical therapy. Patient wants to proceed. Understands <1-2% risk of death, stroke, MI,  urgent CABG, bleeding, infection, renal failure but not limited to these.  Hypertension: Controlled.  H/o stroke: Currently asymptomatic. On Aspirin 81 mg daily-not on Aggrenox as previously recommended by neurology.  I have encouraged her to talk to his PCP regarding establishing care with a neurologist, as necessary.  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  I will obtain a carotid/vertebral artery ultrasound to evaluate this.  Type 2 DM: Continue follow up with PCP.  Hallucination: H/o schizophrenia. She has had recent hallucinations. Strongly recommend following up with PCP and probably seeing a psychiatrist.  F/u w/me in 4 weeks  Maika Mcelveen Esther Hardy, MD [facility] [place] Pager: [phone] Office: [phone] If no answer Cell [phone]

## 2020-07-14 NOTE — Discharge Instructions (Signed)
°  HOLD METFORMIN FOR A FULL 48 HOURS AFTER DISCHARGE.  Femoral Site Care This sheet gives you information about how to care for yourself after your procedure. Your health care provider may also give you more specific instructions. If you have problems or questions, contact your health care provider. What can I expect after the procedure? After the procedure, it is common to have:  Bruising that usually fades within 1-2 weeks.  Tenderness at the site. Follow these instructions at home: Wound care 1. May remove bandage after 24 hours. 2. Do not take baths, swim, or use a hot tub for 5 days. 3. You may shower 24-48 hours after the procedure. ? Gently wash the site with plain soap and water. ? Pat the area dry with a clean towel. ? Do not rub the site. This may cause bleeding. 4. Do not apply powder or lotion to the site. Keep the site clean and dry. 5. Check your femoral site every day for signs of infection. Check for: ? Redness, swelling, or pain. ? Fluid or blood. ? Warmth. ? Pus or a bad smell. Activity 1. For the first 2-3 days after your procedure, or as long as directed: ? Avoid climbing stairs as much as possible. ? Do not squat. 2. Do not lift, push or pull anything that is heavier than 10 lb for 5 days. 3. Rest as directed. ? Avoid sitting for a long time without moving. Get up to take short walks every 1-2 hours. 4. Do not drive for 24 hours. General instructions  Take over-the-counter and prescription medicines only as told by your health care provider.  Keep all follow-up visits as told by your health care provider. This is important.  DRINK PLENTY OF FLUIDS FOR THE NEXT 2-3 DAYS. Contact a health care provider if you have:  A fever or chills.  You have redness, swelling, or pain around your insertion site. Get help right away if:  The catheter insertion area swells very fast.  You pass out.  You suddenly start to sweat or your skin gets clammy.  The  catheter insertion area is bleeding, and the bleeding does not stop when you hold steady pressure on the area.  The area near or just beyond the catheter insertion site becomes pale, cool, tingly, or numb. These symptoms may represent a serious problem that is an emergency. Do not wait to see if the symptoms will go away. Get medical help right away. Call your local emergency services (911 in the [place] Do not drive yourself to the hospital. Summary  After the procedure, it is common to have bruising that usually fades within 1-2 weeks.  Check your femoral site every day for signs of infection.  Do not lift, push or pull anything that is heavier than 10 lb for 5 days.  This information is not intended to replace advice given to you by your health care provider. Make sure you discuss any questions you have with your health care provider. Document Revised: 11/20/2017 Document Reviewed: 11/20/2017 Elsevier Patient Education  2020 [employer] Inc. 

## 2020-07-21 ENCOUNTER — Ambulatory Visit: Payer: HMO | Admitting: Cardiology

## 2020-07-21 ENCOUNTER — Encounter: Payer: Self-pay | Admitting: Cardiology

## 2020-07-21 ENCOUNTER — Other Ambulatory Visit: Payer: Self-pay

## 2020-07-21 VITALS — BP 113/71 | HR 66 | Resp 16 | Ht 65.0 in | Wt 186.0 lb

## 2020-07-21 DIAGNOSIS — I10 Essential (primary) hypertension: Secondary | ICD-10-CM

## 2020-07-21 DIAGNOSIS — E1100 Type 2 diabetes mellitus with hyperosmolarity without nonketotic hyperglycemic-hyperosmolar coma (NKHHC): Secondary | ICD-10-CM | POA: Diagnosis not present

## 2020-07-21 DIAGNOSIS — I25708 Atherosclerosis of coronary artery bypass graft(s), unspecified, with other forms of angina pectoris: Secondary | ICD-10-CM

## 2020-07-21 DIAGNOSIS — M150 Primary generalized (osteo)arthritis: Secondary | ICD-10-CM | POA: Diagnosis not present

## 2020-07-21 DIAGNOSIS — E785 Hyperlipidemia, unspecified: Secondary | ICD-10-CM | POA: Diagnosis not present

## 2020-07-21 MED ORDER — METOPROLOL TARTRATE 25 MG PO TABS: 25.0000 mg | ORAL_TABLET | Freq: Two times a day (BID) | ORAL | 3 refills | Status: AC

## 2020-07-21 MED ORDER — ASPIRIN EC 81 MG PO TBEC: 81.0000 mg | DELAYED_RELEASE_TABLET | Freq: Every day | ORAL | 3 refills | Status: AC

## 2020-07-21 MED ORDER — NITROGLYCERIN 0.4 MG SL SUBL: 0.4000 mg | SUBLINGUAL_TABLET | SUBLINGUAL | 3 refills | Status: AC | PRN

## 2020-07-21 NOTE — Progress Notes (Signed)
Follow up visit  Subjective:   Nicole Lloyd, female    DOB: 17-May-1946, 74 y.o.   MRN: [id number]   Chief Complaint  Patient presents with  . Coronary Artery Disease  . Follow-up     HPI  74 year old African-American female with hypertension, type 2 diabetes mellitus, h/o stroke, schizophrenia, CAD s/p CABG x4 (LIMA-LAD, SVG-diagonal, sequential radial artery graft-PDA/PLA) in 2000 with stable angina  Patient recently underwent coronary and bypass graft angiography due to angina and abnormal stress test findings.  Details below.  Briefly, it was felt that SVG diagonal anastomotic lesion was too complex for intervention due to small caliber of native vessels and trifurcating lesion.  I decided to treat this medically for the time being.  She has not had any recurrent anginal symptoms.  On a separate note, she reports rash and itching in left groin (cath was performed through right common femoral artery)     Current Outpatient Medications on File Prior to Visit  Medication Sig Dispense Refill  . aspirin EC 81 MG tablet Take 81 mg by mouth daily. Swallow whole.    . calcium carbonate (TUMS - DOSED IN MG ELEMENTAL CALCIUM) 500 MG chewable tablet Chew 1-2 tablets by mouth 2 (two) times daily as needed for indigestion or heartburn.     . citalopram (CELEXA) 10 MG tablet Take 10 mg by mouth daily.    Marland Kitchen glimepiride (AMARYL) 1 MG tablet Take 1 mg by mouth daily with breakfast.    . losartan-hydrochlorothiazide (HYZAAR) 100-25 MG tablet TAKE ONE TABLET BY MOUTH ONCE DAILY AS NEEDED (Patient not taking: Reported on 07/08/2020) 90 tablet 1  . metFORMIN (GLUCOPHAGE) 1000 MG tablet Take 1,000 mg by mouth at bedtime.     . metoprolol tartrate (LOPRESSOR) 50 MG tablet TAKE 1/2 TABLET BY MOUTH TWICE DAILY (Patient taking differently: Take 25 mg by mouth in the morning and at bedtime. ) 60 tablet 3  . nitroGLYCERIN (NITROSTAT) 0.4 MG SL tablet Place 1 tablet (0.4 mg total) under the tongue every  5 (five) minutes as needed for chest pain. (Patient taking differently: Place 0.4 mg under the tongue every 5 (five) minutes x 3 doses as needed for chest pain. ) 30 tablet 2  . rosuvastatin (CRESTOR) 40 MG tablet Take 40 mg by mouth every evening.     No current facility-administered medications on file prior to visit.    Cardiovascular studies:  Coronary and bypass graft angiography 07/14/2020: LM: Distal 40% stenosis LAD: Prox 100% occluded LIMA-LAD: Patent. Distal LAD 40% diffuse disease SVG-Diag: Patent with 50% mid vessel lesion, unchanged from prior cath in 2011                 Graft fills a trifurcating diagonal, small in caliber                 50-60% anastomotic site lesion, worse since prior cath in 2011 LCx: Prox 100% occluded.        Collaterals from left atrial branch fill OM with good distal flow RCA: Prox 100% occluded Free radial-RPDA/RPLA: Patent with no significant disease  Given the small caliber of trifurcating diagonal and large size mismatch between SVG and trifurcating Diag, I did not perform PCI. Continue aggressive medical management. If no symptoms improvement, could consider PTA to this anastomotic site in future.    Exercise Myoview stress test 06/08/2020: Exercise nuclear stress test was performed using Bruce protocol. Patient reached 7 METS, and 95% of age predicted maximum  heart rate. Exercise capacity was low. Chest pain not reported. Heart rate and hemodynamic response were normal. Stress EKG demonstrated sinus tachycardia, 1 mm horizontal ST depression leads V4-V6, T wave inversion lads II, III, aVF, normalize 2 min into recovery.  Small sized, mild intensity, partially reversible perfusion defect in inferior/inferolateral myocardium. Stress LVEF 64%. Intermediate risk study.   Echocardiogram 05/21/2020:  Normal LV systolic function with EF 59%. Left ventricle cavity is normal  in size. Normal global wall motion. Doppler evidence of grade I (impaired)    diastolic dysfunction, normal LAP. Calculated EF 59%.  Left atrial cavity is normal in size. Interatrial septum bulges to the  left suggestive of elevated right atrial pressure.  Structurally normal mitral valve. Mild (Grade I) mitral regurgitation.  Structurally normal tricuspid valve. Moderate tricuspid regurgitation.  Mild pulmonary hypertension. RVSP measures 34 mmHg.  EKG 04/13/2020: Sinus rhythm 66 bpm Nonspecific T-abnormality  Carotid artery duplex 27/78/2423:  Peak systolic velocities in the right bifurcation, internal, external and  common carotid arteries are within normal limits.  Doppler flow velocities in the left internal carotid artery are consistent  with stenosis in the range of 16-49% with moderate heterogeneous plaque.  Antegrade right vertebral artery flow. Antegrade left vertebral artery  flow.  Follow up in one year is appropriate if clinically indicated.  Recent labs: 07/14/2020: Glucose 116, BUN/Cr 10/1.13. EGFR 55. Na/K 140/4.4.  H/H 11.9/38.9. MCV 93. Platelets 170  04/03/2020: Glucose 99, BUN/Cr 14/0.8. EGFR 51.  HbA1C 6.8% Chol 115, TG 108, HDL 44, LDL 52 TSH 2.0normal  11/24/2017: H/H 10.8/32.7. Platelets 170.   Review of Systems  Cardiovascular: Negative for chest pain, dyspnea on exertion, leg swelling, palpitations and syncope.         Vitals:   07/21/20 1124  BP: 113/71  Pulse: 66  Resp: 16  SpO2: 96%     Body mass index is 30.95 kg/m. Filed Weights   07/21/20 1124  Weight: 186 lb (84.4 kg)     Objective:   Physical Exam Vitals and nursing note reviewed.  Constitutional:      General: She is not in acute distress. Neck:     Vascular: No JVD.  Cardiovascular:     Rate and Rhythm: Normal rate and regular rhythm.     Pulses: Intact distal pulses.     Heart sounds: Normal heart sounds. No murmur heard.   Pulmonary:     Effort: Pulmonary effort is normal.     Breath sounds: Normal breath sounds. No wheezing or  rales.           Assessment & Recommendations:   74 year old African-American female with hypertension, type 2 diabetes mellitus, h/o stroke, schizophrenia, CAD s/p CABG x4 (LIMA-LAD, SVG-diagonal, sequential radial artery graft-PDA/PLA) in 2000 with stable angina  CAD: S/p CABG.  SVG-diagonal anastomotic severe lesion, not attractive for intervention due to small caliber of native vessel and trifurcating lesion.  Also has chronically occluded circumflex which could also be contributing to angina and abnormal stress test findings. Recommend medical management at this time.  Continue aspirin, crestor 40 mg, losartan-HCTZ 100-25 mg, metoprolol tartarate 25 mg bid, nitroglycerin as needed.  Hypertension: Controlled.  H/o stroke: Currently asymptomatic. On Aspirin 81 mg daily-not on Aggrenox as previously recommended by neurology.  I have encouraged her to talk to his PCP regarding establishing care with a neurologist, as necessary.    Type 2 DM: Continue follow up with PCP.  Hallucination: H/o schizophrenia. She has had recent hallucinations. Strongly recommend  following up with PCP and probably seeing a psychiatrist.  I encourage patient to use over-the-counter clotrimazole ointment for her groin rash.  If symptoms do not improve, she should contact her PCP.  Follow-up in 3 months.   Nigel Mormon, MD [facility]. [place] Pager: [phone] Office: [phone] If no answer Cell [phone]

## 2020-07-24 ENCOUNTER — Ambulatory Visit: Payer: HMO | Admitting: Cardiology

## 2020-08-20 DIAGNOSIS — E785 Hyperlipidemia, unspecified: Secondary | ICD-10-CM | POA: Diagnosis not present

## 2020-08-20 DIAGNOSIS — I10 Essential (primary) hypertension: Secondary | ICD-10-CM | POA: Diagnosis not present

## 2020-08-20 DIAGNOSIS — E1100 Type 2 diabetes mellitus with hyperosmolarity without nonketotic hyperglycemic-hyperosmolar coma (NKHHC): Secondary | ICD-10-CM | POA: Diagnosis not present

## 2020-08-21 DIAGNOSIS — R0981 Nasal congestion: Secondary | ICD-10-CM | POA: Diagnosis not present

## 2020-08-21 DIAGNOSIS — Z20822 Contact with and (suspected) exposure to covid-19: Secondary | ICD-10-CM | POA: Diagnosis not present

## 2020-08-21 DIAGNOSIS — J3489 Other specified disorders of nose and nasal sinuses: Secondary | ICD-10-CM | POA: Diagnosis not present

## 2020-08-25 DIAGNOSIS — M549 Dorsalgia, unspecified: Secondary | ICD-10-CM | POA: Diagnosis not present

## 2020-08-25 DIAGNOSIS — M150 Primary generalized (osteo)arthritis: Secondary | ICD-10-CM | POA: Diagnosis not present

## 2020-08-25 DIAGNOSIS — I10 Essential (primary) hypertension: Secondary | ICD-10-CM | POA: Diagnosis not present

## 2020-08-25 DIAGNOSIS — E1169 Type 2 diabetes mellitus with other specified complication: Secondary | ICD-10-CM | POA: Diagnosis not present

## 2020-08-26 DIAGNOSIS — I10 Essential (primary) hypertension: Secondary | ICD-10-CM | POA: Diagnosis not present

## 2020-08-26 DIAGNOSIS — Z6833 Body mass index (BMI) 33.0-33.9, adult: Secondary | ICD-10-CM | POA: Diagnosis not present

## 2020-08-26 DIAGNOSIS — E6609 Other obesity due to excess calories: Secondary | ICD-10-CM | POA: Diagnosis not present

## 2020-08-26 DIAGNOSIS — M150 Primary generalized (osteo)arthritis: Secondary | ICD-10-CM | POA: Diagnosis not present

## 2020-08-26 DIAGNOSIS — E785 Hyperlipidemia, unspecified: Secondary | ICD-10-CM | POA: Diagnosis not present

## 2020-08-26 DIAGNOSIS — E1169 Type 2 diabetes mellitus with other specified complication: Secondary | ICD-10-CM | POA: Diagnosis not present

## 2020-08-31 ENCOUNTER — Other Ambulatory Visit: Payer: HMO

## 2020-09-01 ENCOUNTER — Other Ambulatory Visit: Payer: Self-pay

## 2020-09-01 ENCOUNTER — Ambulatory Visit: Payer: HMO

## 2020-09-01 DIAGNOSIS — I6522 Occlusion and stenosis of left carotid artery: Secondary | ICD-10-CM

## 2020-09-01 DIAGNOSIS — Z8673 Personal history of transient ischemic attack (TIA), and cerebral infarction without residual deficits: Secondary | ICD-10-CM | POA: Diagnosis not present

## 2020-09-06 ENCOUNTER — Other Ambulatory Visit: Payer: Self-pay | Admitting: Cardiology

## 2020-09-06 DIAGNOSIS — I6522 Occlusion and stenosis of left carotid artery: Secondary | ICD-10-CM

## 2020-09-07 NOTE — Progress Notes (Signed)
I placed future orders. Stable and seeing you on 29th

## 2020-09-11 ENCOUNTER — Ambulatory Visit: Payer: HMO | Admitting: Cardiology

## 2020-09-19 DIAGNOSIS — E785 Hyperlipidemia, unspecified: Secondary | ICD-10-CM | POA: Diagnosis not present

## 2020-09-19 DIAGNOSIS — M150 Primary generalized (osteo)arthritis: Secondary | ICD-10-CM | POA: Diagnosis not present

## 2020-09-19 DIAGNOSIS — E1100 Type 2 diabetes mellitus with hyperosmolarity without nonketotic hyperglycemic-hyperosmolar coma (NKHHC): Secondary | ICD-10-CM | POA: Diagnosis not present

## 2020-09-19 DIAGNOSIS — I10 Essential (primary) hypertension: Secondary | ICD-10-CM | POA: Diagnosis not present

## 2020-10-18 NOTE — Progress Notes (Signed)
Error

## 2020-10-19 ENCOUNTER — Ambulatory Visit: Payer: HMO | Admitting: Cardiology

## 2020-10-20 DIAGNOSIS — E785 Hyperlipidemia, unspecified: Secondary | ICD-10-CM | POA: Diagnosis not present

## 2020-10-20 DIAGNOSIS — M150 Primary generalized (osteo)arthritis: Secondary | ICD-10-CM | POA: Diagnosis not present

## 2020-10-20 DIAGNOSIS — E1100 Type 2 diabetes mellitus with hyperosmolarity without nonketotic hyperglycemic-hyperosmolar coma (NKHHC): Secondary | ICD-10-CM | POA: Diagnosis not present

## 2020-10-20 DIAGNOSIS — I10 Essential (primary) hypertension: Secondary | ICD-10-CM | POA: Diagnosis not present

## 2020-10-30 ENCOUNTER — Telehealth: Payer: Self-pay

## 2020-10-30 NOTE — Telephone Encounter (Signed)
Called to r/s 10/19/20 appointment

## 2020-11-04 ENCOUNTER — Other Ambulatory Visit: Payer: Self-pay

## 2020-11-04 NOTE — Patient Outreach (Signed)
  [facility] Care Management Chronic Special Needs Program    11/04/2020  Name: KENSIE SUSMAN, DOB: 05-Aug-1946  MRN: [id number]   Ms. Pearley Millington is enrolled in a chronic special needs plan for Diabetes. [facility] will continue to provide services for this member through 11/20/2020. The [facility] Care Management Team will assume care 11/21/2020.   Thea Silversmith, RN, MSN, [place] [place] [phone]

## 2020-11-10 DIAGNOSIS — L639 Alopecia areata, unspecified: Secondary | ICD-10-CM | POA: Diagnosis not present

## 2020-11-10 DIAGNOSIS — Z0000 Encounter for general adult medical examination without abnormal findings: Secondary | ICD-10-CM | POA: Diagnosis not present

## 2020-11-10 DIAGNOSIS — D644 Congenital dyserythropoietic anemia: Secondary | ICD-10-CM | POA: Diagnosis not present

## 2020-11-10 DIAGNOSIS — E1169 Type 2 diabetes mellitus with other specified complication: Secondary | ICD-10-CM | POA: Diagnosis not present

## 2020-11-20 DIAGNOSIS — M150 Primary generalized (osteo)arthritis: Secondary | ICD-10-CM | POA: Diagnosis not present

## 2020-11-20 DIAGNOSIS — I10 Essential (primary) hypertension: Secondary | ICD-10-CM | POA: Diagnosis not present

## 2020-11-20 DIAGNOSIS — E1100 Type 2 diabetes mellitus with hyperosmolarity without nonketotic hyperglycemic-hyperosmolar coma (NKHHC): Secondary | ICD-10-CM | POA: Diagnosis not present

## 2020-11-20 DIAGNOSIS — E785 Hyperlipidemia, unspecified: Secondary | ICD-10-CM | POA: Diagnosis not present

## 2020-11-27 ENCOUNTER — Other Ambulatory Visit: Payer: Self-pay

## 2020-12-21 DIAGNOSIS — M150 Primary generalized (osteo)arthritis: Secondary | ICD-10-CM | POA: Diagnosis not present

## 2020-12-21 DIAGNOSIS — E785 Hyperlipidemia, unspecified: Secondary | ICD-10-CM | POA: Diagnosis not present

## 2020-12-21 DIAGNOSIS — E1100 Type 2 diabetes mellitus with hyperosmolarity without nonketotic hyperglycemic-hyperosmolar coma (NKHHC): Secondary | ICD-10-CM | POA: Diagnosis not present

## 2020-12-21 DIAGNOSIS — I10 Essential (primary) hypertension: Secondary | ICD-10-CM | POA: Diagnosis not present

## 2021-01-05 DIAGNOSIS — Z961 Presence of intraocular lens: Secondary | ICD-10-CM | POA: Diagnosis not present

## 2021-01-05 DIAGNOSIS — E119 Type 2 diabetes mellitus without complications: Secondary | ICD-10-CM | POA: Diagnosis not present

## 2021-01-05 DIAGNOSIS — H524 Presbyopia: Secondary | ICD-10-CM | POA: Diagnosis not present

## 2021-01-11 DIAGNOSIS — L639 Alopecia areata, unspecified: Secondary | ICD-10-CM | POA: Diagnosis not present

## 2021-01-11 DIAGNOSIS — E782 Mixed hyperlipidemia: Secondary | ICD-10-CM | POA: Diagnosis not present

## 2021-01-11 DIAGNOSIS — M130 Polyarthritis, unspecified: Secondary | ICD-10-CM | POA: Diagnosis not present

## 2021-01-11 DIAGNOSIS — E1169 Type 2 diabetes mellitus with other specified complication: Secondary | ICD-10-CM | POA: Diagnosis not present

## 2021-01-11 DIAGNOSIS — F329 Major depressive disorder, single episode, unspecified: Secondary | ICD-10-CM | POA: Diagnosis not present

## 2021-01-11 DIAGNOSIS — F3289 Other specified depressive episodes: Secondary | ICD-10-CM | POA: Diagnosis not present

## 2021-01-11 DIAGNOSIS — E6609 Other obesity due to excess calories: Secondary | ICD-10-CM | POA: Diagnosis not present

## 2021-01-11 DIAGNOSIS — I10 Essential (primary) hypertension: Secondary | ICD-10-CM | POA: Diagnosis not present

## 2021-01-18 DIAGNOSIS — M150 Primary generalized (osteo)arthritis: Secondary | ICD-10-CM | POA: Diagnosis not present

## 2021-01-18 DIAGNOSIS — I10 Essential (primary) hypertension: Secondary | ICD-10-CM | POA: Diagnosis not present

## 2021-01-18 DIAGNOSIS — E1100 Type 2 diabetes mellitus with hyperosmolarity without nonketotic hyperglycemic-hyperosmolar coma (NKHHC): Secondary | ICD-10-CM | POA: Diagnosis not present

## 2021-01-18 DIAGNOSIS — E785 Hyperlipidemia, unspecified: Secondary | ICD-10-CM | POA: Diagnosis not present

## 2021-02-18 DIAGNOSIS — E1100 Type 2 diabetes mellitus with hyperosmolarity without nonketotic hyperglycemic-hyperosmolar coma (NKHHC): Secondary | ICD-10-CM | POA: Diagnosis not present

## 2021-02-18 DIAGNOSIS — I10 Essential (primary) hypertension: Secondary | ICD-10-CM | POA: Diagnosis not present

## 2021-02-18 DIAGNOSIS — E785 Hyperlipidemia, unspecified: Secondary | ICD-10-CM | POA: Diagnosis not present

## 2021-06-25 ENCOUNTER — Ambulatory Visit (HOSPITAL_COMMUNITY)
Admission: EM | Admit: 2021-06-25 | Discharge: 2021-06-25 | Disposition: A | Discharge status: Home / Self Care | Payer: Medicare Other | Attending: Emergency Medicine | Admitting: Emergency Medicine

## 2021-06-25 ENCOUNTER — Ambulatory Visit (INDEPENDENT_AMBULATORY_CARE_PROVIDER_SITE_OTHER): Payer: Medicare Other

## 2021-06-25 ENCOUNTER — Other Ambulatory Visit: Payer: Self-pay

## 2021-06-25 ENCOUNTER — Encounter (HOSPITAL_COMMUNITY): Payer: Self-pay

## 2021-06-25 DIAGNOSIS — M542 Cervicalgia: Secondary | ICD-10-CM

## 2021-06-25 DIAGNOSIS — S161XXA Strain of muscle, fascia and tendon at neck level, initial encounter: Secondary | ICD-10-CM

## 2021-06-25 NOTE — Discharge Instructions (Addendum)
Take tylenol for pain  X ray was normal If you cont to have pain after 1 week to call orthopedic to be seen
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
## 2021-07-08 ENCOUNTER — Emergency Department (HOSPITAL_COMMUNITY): Payer: Medicare Other

## 2021-07-08 ENCOUNTER — Other Ambulatory Visit: Payer: Self-pay

## 2021-07-08 ENCOUNTER — Emergency Department (HOSPITAL_COMMUNITY)
Admission: EM | Admit: 2021-07-08 | Discharge: 2021-07-08 | Disposition: A | Discharge status: Home / Self Care | Payer: Medicare Other | Attending: Emergency Medicine | Admitting: Emergency Medicine

## 2021-07-08 DIAGNOSIS — I509 Heart failure, unspecified: Secondary | ICD-10-CM | POA: Diagnosis not present

## 2021-07-08 DIAGNOSIS — U071 COVID-19: Secondary | ICD-10-CM | POA: Diagnosis not present

## 2021-07-08 DIAGNOSIS — Z7984 Long term (current) use of oral hypoglycemic drugs: Secondary | ICD-10-CM | POA: Insufficient documentation

## 2021-07-08 DIAGNOSIS — N182 Chronic kidney disease, stage 2 (mild): Secondary | ICD-10-CM | POA: Diagnosis not present

## 2021-07-08 DIAGNOSIS — I130 Hypertensive heart and chronic kidney disease with heart failure and stage 1 through stage 4 chronic kidney disease, or unspecified chronic kidney disease: Secondary | ICD-10-CM | POA: Insufficient documentation

## 2021-07-08 DIAGNOSIS — R059 Cough, unspecified: Secondary | ICD-10-CM

## 2021-07-08 DIAGNOSIS — I25119 Atherosclerotic heart disease of native coronary artery with unspecified angina pectoris: Secondary | ICD-10-CM | POA: Insufficient documentation

## 2021-07-08 DIAGNOSIS — Z7982 Long term (current) use of aspirin: Secondary | ICD-10-CM | POA: Diagnosis not present

## 2021-07-08 DIAGNOSIS — Z955 Presence of coronary angioplasty implant and graft: Secondary | ICD-10-CM | POA: Insufficient documentation

## 2021-07-08 DIAGNOSIS — E119 Type 2 diabetes mellitus without complications: Secondary | ICD-10-CM | POA: Insufficient documentation

## 2021-07-08 DIAGNOSIS — Z79899 Other long term (current) drug therapy: Secondary | ICD-10-CM | POA: Insufficient documentation

## 2021-07-08 DIAGNOSIS — R079 Chest pain, unspecified: Secondary | ICD-10-CM | POA: Diagnosis present

## 2021-07-08 LAB — CBC WITH DIFFERENTIAL/PLATELET
Abs Immature Granulocytes: 0.04 10*3/uL (ref 0.00–0.07)
Basophils Absolute: 0.1 10*3/uL (ref 0.0–0.1)
Basophils Relative: 1 %
Eosinophils Absolute: 0.1 10*3/uL (ref 0.0–0.5)
Eosinophils Relative: 1 %
HCT: 38.8 % (ref 36.0–46.0)
Hemoglobin: 12.4 g/dL (ref 12.0–15.0)
Immature Granulocytes: 1 %
Lymphocytes Relative: 21 %
Lymphs Abs: 1.6 10*3/uL (ref 0.7–4.0)
MCH: 29.6 pg (ref 26.0–34.0)
MCHC: 32 g/dL (ref 30.0–36.0)
MCV: 92.6 fL (ref 80.0–100.0)
Monocytes Absolute: 1 10*3/uL (ref 0.1–1.0)
Monocytes Relative: 14 %
Neutro Abs: 4.8 10*3/uL (ref 1.7–7.7)
Neutrophils Relative %: 62 %
Platelets: 151 10*3/uL (ref 150–400)
RBC: 4.19 MIL/uL (ref 3.87–5.11)
RDW: 13.8 % (ref 11.5–15.5)
WBC: 7.5 10*3/uL (ref 4.0–10.5)
nRBC: 0 % (ref 0.0–0.2)

## 2021-07-08 LAB — COMPREHENSIVE METABOLIC PANEL
ALT: 11 U/L (ref 0–44)
AST: 20 U/L (ref 15–41)
Albumin: 3.7 g/dL (ref 3.5–5.0)
Alkaline Phosphatase: 44 U/L (ref 38–126)
Anion gap: 9 (ref 5–15)
BUN: 15 mg/dL (ref 8–23)
CO2: 24 mmol/L (ref 22–32)
Calcium: 9.3 mg/dL (ref 8.9–10.3)
Chloride: 102 mmol/L (ref 98–111)
Creatinine, Ser: 1.2 mg/dL — ABNORMAL HIGH (ref 0.44–1.00)
GFR, Estimated: 47 mL/min — ABNORMAL LOW (ref >60)
Glucose, Bld: 121 mg/dL — ABNORMAL HIGH (ref 70–99)
Potassium: 4.3 mmol/L (ref 3.5–5.1)
Sodium: 135 mmol/L (ref 135–145)
Total Bilirubin: 0.9 mg/dL (ref 0.3–1.2)
Total Protein: 6.8 g/dL (ref 6.5–8.1)

## 2021-07-08 LAB — LIPASE, BLOOD: Lipase: 22 U/L (ref 11–51)

## 2021-07-08 LAB — RESP PANEL BY RT-PCR (FLU A&B, COVID) ARPGX2
Influenza A by PCR: NEGATIVE
Influenza B by PCR: NEGATIVE
SARS Coronavirus 2 by RT PCR: POSITIVE — AB

## 2021-07-08 LAB — TROPONIN I (HIGH SENSITIVITY)
Troponin I (High Sensitivity): 6 ng/L (ref <18)
Troponin I (High Sensitivity): 5 ng/L (ref <18)

## 2021-07-08 MED ORDER — ACETAMINOPHEN 325 MG PO TABS: 650.0000 mg | ORAL_TABLET | Freq: Four times a day (QID) | ORAL | 0 refills | Status: AC | PRN

## 2021-07-08 MED ORDER — PAXLOVID (300/100) 20 X 150 MG & 10 X 100MG PO TBPK: ORAL_TABLET | ORAL | 0 refills | Status: AC

## 2021-07-08 MED ORDER — GUAIFENESIN 100 MG/5ML PO SYRP: 100.0000 mg | ORAL_SOLUTION | ORAL | 0 refills | Status: AC | PRN

## 2021-07-08 MED ORDER — ONDANSETRON HCL 4 MG PO TABS: 4.0000 mg | ORAL_TABLET | Freq: Four times a day (QID) | ORAL | 0 refills | Status: AC

## 2021-07-08 MED ORDER — ONDANSETRON 4 MG PO TBDP
4.0000 mg | ORAL_TABLET | Freq: Once | ORAL | Status: AC
  Administered 2021-07-08: 4 mg via ORAL
  Filled 2021-07-08: qty 1

## 2021-07-08 NOTE — ED Provider Notes (Signed)
[facility] EMERGENCY DEPARTMENT Provider Note   CSN: [id number] Arrival date & time: 07/08/21  1416     History Chief Complaint  Patient presents with   Chest Pain   Cough    Nicole Lloyd is a 75 y.o. female.  Patient is a 75 yo female presenting for chest pain, body aches, cough, and subjective fevers. Patient states chest pain started last night and lasted until this morning at 10AM, constant, non radiating, and associated with "rattling noise in my chest" and cough. Pt denies any orthopnea, hx of CHF, diuretic use, or lower leg swelling. Denies hx of DVT/PE, recent immobilization/surgery, leg swelling/pain, or hormone therapy.   The history is provided by the patient. No language interpreter was used.  Chest Pain Pain radiates to:  Does not radiate Pain severity:  Mild Timing:  Constant Progression:  Resolved Chronicity:  New Associated symptoms: cough and fever   Associated symptoms: no abdominal pain, no back pain, no palpitations, no shortness of breath and no vomiting   Cough Associated symptoms: chest pain, fever and myalgias   Associated symptoms: no chills, no ear pain, no rash, no shortness of breath and no sore throat       Past Medical History:  Diagnosis Date   Arthritis    Cholelithiases    Coronary artery disease    CVA (cerebral infarction)    Diabetes mellitus without complication (HCC)    Gall stones    GERD (gastroesophageal reflux disease)    Hypertension    Myocardial infarction ([place])    Schizophrenia ([place])    Stroke ([place]) 06/1015   right side weakness, some speech impairment at times    Patient Active Problem List   Diagnosis Date Noted   Asymptomatic stenosis of left carotid artery 07/03/2020   Abnormal stress test 07/03/2020   Essential hypertension 04/13/2020   Hallucinations 04/13/2020   Allergic rhinitis 09/09/2019   Impaired functional mobility, balance, gait, and endurance 09/09/2019   H/O: stroke 09/09/2019    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/09/2017   Coronary artery disease of native artery of native heart with stable angina pectoris ([place]) 02/09/2017   Congestive heart failure, NYHA class III, acute on chronic, combined ([place]) 02/09/2017   OSA on CPAP 02/09/2017   Influenza 12/09/2016   Chronic cholecystitis with calculus 09/06/2016   CKD (chronic kidney disease) stage 2, GFR 60-89 ml/min 10/03/2015   Cerebrovascular accident (CVA) due to vascular stenosis ([place]) 07/03/2015   Type 2 diabetes mellitus ([place]) 08/20/2014   Abnormal electrocardiogram 08/20/2014   Carotid bruit 08/20/2014   Chest tightness or pressure 08/20/2014   Coronary artery disease involving coronary bypass graft with angina pectoris ([place]) 08/20/2014   Dyslipidemia 08/20/2014   DOE (dyspnea on exertion) 08/20/2014    Past Surgical History:  Procedure Laterality Date   CHOLECYSTECTOMY N/A 09/06/2016   Procedure: LAPAROSCOPIC CHOLECYSTECTOMY WITH INTRAOPERATIVE CHOLANGIOGRAM;  Surgeon: Donnie Mesa, MD;  Location: [facility];  Service: General;  Laterality: N/A;   CORONARY ARTERY BYPASS GRAFT     LEFT HEART CATH AND CORS/GRAFTS ANGIOGRAPHY N/A 07/14/2020   Procedure: LEFT HEART CATH AND CORS/GRAFTS ANGIOGRAPHY;  Surgeon: Nigel Mormon, MD;  Location: [facility];  Service: Cardiovascular;  Laterality: N/A;   oopherectomy Bilateral    TOTAL ABDOMINAL HYSTERECTOMY     VENTRAL HERNIA REPAIR       OB History   No obstetric history on file.     Family History  Problem Relation  Age of Onset   Arthritis Mother    CVA Mother    Depression Mother    Diabetes Mother    Hypertension Mother    Seizures Mother    Alcohol abuse Brother    Cervical cancer Daughter     Social History   Tobacco Use   Smoking status: Never   Smokeless tobacco: Never  Vaping Use   Vaping Use: Never used  Substance Use Topics   Alcohol use: No   Drug use: No    Home Medications Prior to Admission  medications   Medication Sig Start Date End Date Taking? Authorizing Provider  aspirin EC 81 MG tablet Take 1 tablet (81 mg total) by mouth daily. Swallow whole. 07/21/20   Patwardhan, Reynold Bowen, MD  calcium carbonate (TUMS - DOSED IN MG ELEMENTAL CALCIUM) 500 MG chewable tablet Chew 1-2 tablets by mouth 2 (two) times daily as needed for indigestion or heartburn.     [provider]  citalopram (CELEXA) 10 MG tablet Take 10 mg by mouth daily.    [provider]  glimepiride (AMARYL) 1 MG tablet Take 1 mg by mouth daily with breakfast.    [provider]  losartan-hydrochlorothiazide (HYZAAR) 100-25 MG tablet TAKE ONE TABLET BY MOUTH ONCE DAILY AS NEEDED 06/22/20   Patwardhan, Manish J, MD  metFORMIN (GLUCOPHAGE) 1000 MG tablet Take 1,000 mg by mouth at bedtime.     [provider]  metoprolol tartrate (LOPRESSOR) 25 MG tablet Take 1 tablet (25 mg total) by mouth in the morning and at bedtime. 07/21/20   Patwardhan, Reynold Bowen, MD  nitroGLYCERIN (NITROSTAT) 0.4 MG SL tablet Place 1 tablet (0.4 mg total) under the tongue every 5 (five) minutes x 3 doses as needed for chest pain. 07/21/20 10/19/20  Patwardhan, Reynold Bowen, MD  rosuvastatin (CRESTOR) 40 MG tablet Take 40 mg by mouth every evening.    [provider]    Allergies    Patient has no known allergies.  Review of Systems   Review of Systems  Constitutional:  Positive for fever. Negative for chills.  HENT:  Negative for ear pain and sore throat.   Eyes:  Negative for pain and visual disturbance.  Respiratory:  Positive for cough. Negative for shortness of breath.   Cardiovascular:  Positive for chest pain. Negative for palpitations.  Gastrointestinal:  Negative for abdominal pain and vomiting.  Genitourinary:  Negative for dysuria and hematuria.  Musculoskeletal:  Positive for myalgias. Negative for arthralgias and back pain.  Skin:  Negative for color change and rash.  Neurological:  Negative for  seizures and syncope.  All other systems reviewed and are negative.  Physical Exam Updated Vital Signs BP 123/79   Pulse 73   Temp 99.8 F (37.7 C)   Resp (!) 23   SpO2 100%   Physical Exam Vitals and nursing note reviewed.  Constitutional:      General: She is not in acute distress.    Appearance: She is well-developed.  HENT:     Head: Normocephalic and atraumatic.  Eyes:     Conjunctiva/sclera: Conjunctivae normal.  Cardiovascular:     Rate and Rhythm: Normal rate and regular rhythm.     Heart sounds: No murmur heard. Pulmonary:     Effort: Pulmonary effort is normal. No respiratory distress.     Breath sounds: Normal breath sounds.  Abdominal:     Palpations: Abdomen is soft.     Tenderness: There is no abdominal tenderness.  Musculoskeletal:     Cervical back: Neck supple.  Skin:    General: Skin is warm and dry.  Neurological:     Mental Status: She is alert.    ED Results / Procedures / Treatments   Labs (all labs ordered are listed, but only abnormal results are displayed) Labs Reviewed  COMPREHENSIVE METABOLIC PANEL - Abnormal; Notable for the following components:      Result Value   Glucose, Bld 121 (*)    Creatinine, Ser 1.20 (*)    GFR, Estimated 47 (*)    All other components within normal limits  RESP PANEL BY RT-PCR (FLU A&B, COVID) ARPGX2  LIPASE, BLOOD  CBC WITH DIFFERENTIAL/PLATELET  URINALYSIS, ROUTINE W REFLEX MICROSCOPIC  TROPONIN I (HIGH SENSITIVITY)  TROPONIN I (HIGH SENSITIVITY)    EKG None  Radiology DG Chest 2 View  Result Date: 07/08/2021 CLINICAL DATA:  Chest pain. EXAM: CHEST - 2 VIEW COMPARISON:  December 27, 2010. FINDINGS: The heart size and mediastinal contours are within normal limits. Sternotomy wires are noted. Both lungs are clear. The visualized skeletal structures are unremarkable. IMPRESSION: No active cardiopulmonary disease. Electronically Signed   By: Marijo Conception M.D.   On: 07/08/2021 15:27     Procedures Procedures   Medications Ordered in ED Medications - No data to display  ED Course  I have reviewed the triage vital signs and the nursing notes.  Pertinent labs & imaging results that were available during my care of the patient were reviewed by me and considered in my medical decision making (see chart for details).    MDM Rules/Calculators/A&P                         9:38 PM 75 yo female presenting for chest pain, body aches, cough, and subjective fevers. Fraser Din is Aox3, no acute distress, afebrile, with stable vitals.   Cough and chest pain: -EKG stable with no STEMI or NSTEMI. T wave inversions in inferior leads as seen on previous EKGs August 2021. Stable troponin, electrolytes, and cxr. No abnormal lung sounds of evidence of fluid overloaded state-doubt CHF. CXR demonstrates no pneumonia. No pneumothorax. No hx of DVT/PE, recent immobilized state, recent surgeries, hormone replacement therapy, tachycardia, or sob-doubt PE.   - Covid pcr positive. No hypoxia or signs or respiratory distress. Patient given prescription for Paxlovid, robitussin, and zofran for symptomatic relief.   Patient in no distress and overall condition improved here in the ED. Detailed discussions were had with the patient regarding current findings, and need for close f/u with PCP or on call doctor. The patient has been instructed to return immediately if the symptoms worsen in any way for re-evaluation. Patient verbalized understanding and is in agreement with current care plan. All questions answered prior to discharge.     Final Clinical Impression(s) / ED Diagnoses Final diagnoses:  COVID  Cough  Chest pain, unspecified type    Rx / DC Orders ED Discharge Orders     None        Lianne Cure, DO [id number]

## 2021-07-08 NOTE — ED Notes (Signed)
Pt reports cough, pain in ribs, weakness since Monday, loss of appetite since yesterday.

## 2021-07-08 NOTE — ED Provider Notes (Signed)
Emergency Medicine Provider Triage Evaluation Note  Nicole Lloyd , a 75 y.o. female  was evaluated in triage.  Pt complains of chest pain, abdominal pain, and cough.  Patient reports that chest pain and abdominal pain started last night.  Chest pain is central and radiates to epigastric area.  Patient also complains of pain to left lower quadrant.  Patient endorses nausea without any vomiting.  Patient reports diarrhea on Monday.  Patient endorses shortness of breath at baseline, denies any change at present.  Patient reports that her cough started today.  Patient reports cough is productive.  Patient reports history of aphasia at baseline due to previous stroke.  Review of Systems  Positive: Chest pain, abdominal pain, nausea, diarrhea, shortness of breath Negative: Fever, chills, vomiting, constipation,   Physical Exam  BP 134/63 (BP Location: Left Arm)   Pulse 76   Temp 99.8 F (37.7 C)   Resp 15   SpO2 92%  Gen:   Awake, no distress   Resp:  Normal effort MSK:   Moves extremities without difficulty, no swelling or tenderness to bilateral lower extremities. Other:  Abdomen soft, nondistended, tenderness to left lower quadrant, no guarding or rebound tenderness.  Medical Decision Making  Medically screening exam initiated at 2:35 PM.  Appropriate orders placed.  Nicole Lloyd was informed that the remainder of the evaluation will be completed by another provider, this initial triage assessment does not replace that evaluation, and the importance of remaining in the ED until their evaluation is complete.  The patient appears stable so that the remainder of the work up may be completed by another provider.      Loni Beckwith, PA-C 07/08/21 1438    Valarie Merino, MD 07/10/21 [phone]

## 2021-07-08 NOTE — ED Triage Notes (Signed)
Pt reports central chest pain with upper abdominal pain, starting last night. Cough beginning last night. Endorses nausea without vomiting, diarrhea only on Monday, chronic shob.

## 2021-09-01 ENCOUNTER — Telehealth: Payer: Self-pay | Admitting: Cardiology

## 2021-09-01 NOTE — Telephone Encounter (Signed)
Lvm for pt to cb and sch 1 year f/u with MP and needs to sch carotid duplex

## 2021-10-05 ENCOUNTER — Other Ambulatory Visit: Payer: Self-pay | Admitting: Family Medicine

## 2021-10-05 DIAGNOSIS — Z1231 Encounter for screening mammogram for malignant neoplasm of breast: Secondary | ICD-10-CM

## 2021-10-18 ENCOUNTER — Ambulatory Visit
Admission: RE | Admit: 2021-10-18 | Discharge: 2021-10-18 | Disposition: A | Discharge status: Home / Self Care | Payer: Medicare Other | Source: Ambulatory Visit | Attending: Family Medicine | Admitting: Family Medicine

## 2021-10-18 ENCOUNTER — Other Ambulatory Visit: Payer: Self-pay

## 2021-10-18 DIAGNOSIS — Z1231 Encounter for screening mammogram for malignant neoplasm of breast: Secondary | ICD-10-CM

## 2021-11-06 ENCOUNTER — Emergency Department (HOSPITAL_BASED_OUTPATIENT_CLINIC_OR_DEPARTMENT_OTHER)
Admission: EM | Admit: 2021-11-06 | Discharge: 2021-11-06 | Disposition: A | Discharge status: Home / Self Care | Payer: Medicare Other | Attending: Emergency Medicine | Admitting: Emergency Medicine

## 2021-11-06 ENCOUNTER — Other Ambulatory Visit: Payer: Self-pay

## 2021-11-06 ENCOUNTER — Encounter (HOSPITAL_BASED_OUTPATIENT_CLINIC_OR_DEPARTMENT_OTHER): Payer: Self-pay | Admitting: Emergency Medicine

## 2021-11-06 ENCOUNTER — Emergency Department (HOSPITAL_BASED_OUTPATIENT_CLINIC_OR_DEPARTMENT_OTHER): Payer: Medicare Other

## 2021-11-06 DIAGNOSIS — I25118 Atherosclerotic heart disease of native coronary artery with other forms of angina pectoris: Secondary | ICD-10-CM | POA: Insufficient documentation

## 2021-11-06 DIAGNOSIS — E1122 Type 2 diabetes mellitus with diabetic chronic kidney disease: Secondary | ICD-10-CM | POA: Diagnosis not present

## 2021-11-06 DIAGNOSIS — R1032 Left lower quadrant pain: Secondary | ICD-10-CM | POA: Diagnosis present

## 2021-11-06 DIAGNOSIS — Z79899 Other long term (current) drug therapy: Secondary | ICD-10-CM | POA: Diagnosis not present

## 2021-11-06 DIAGNOSIS — L03316 Cellulitis of umbilicus: Secondary | ICD-10-CM | POA: Insufficient documentation

## 2021-11-06 DIAGNOSIS — N390 Urinary tract infection, site not specified: Secondary | ICD-10-CM | POA: Diagnosis not present

## 2021-11-06 DIAGNOSIS — N182 Chronic kidney disease, stage 2 (mild): Secondary | ICD-10-CM | POA: Insufficient documentation

## 2021-11-06 DIAGNOSIS — I129 Hypertensive chronic kidney disease with stage 1 through stage 4 chronic kidney disease, or unspecified chronic kidney disease: Secondary | ICD-10-CM | POA: Diagnosis not present

## 2021-11-06 DIAGNOSIS — L0390 Cellulitis, unspecified: Secondary | ICD-10-CM

## 2021-11-06 DIAGNOSIS — Z7984 Long term (current) use of oral hypoglycemic drugs: Secondary | ICD-10-CM | POA: Diagnosis not present

## 2021-11-06 DIAGNOSIS — Z951 Presence of aortocoronary bypass graft: Secondary | ICD-10-CM | POA: Insufficient documentation

## 2021-11-06 DIAGNOSIS — Z7982 Long term (current) use of aspirin: Secondary | ICD-10-CM | POA: Diagnosis not present

## 2021-11-06 LAB — URINALYSIS, ROUTINE W REFLEX MICROSCOPIC
Bilirubin Urine: NEGATIVE
Glucose, UA: NEGATIVE mg/dL
Hgb urine dipstick: NEGATIVE
Ketones, ur: NEGATIVE mg/dL
Nitrite: NEGATIVE
Protein, ur: NEGATIVE mg/dL
Specific Gravity, Urine: 1.017 (ref 1.005–1.030)
pH: 5 (ref 5.0–8.0)

## 2021-11-06 LAB — COMPREHENSIVE METABOLIC PANEL
ALT: 7 U/L (ref 0–44)
AST: 15 U/L (ref 15–41)
Albumin: 3.8 g/dL (ref 3.5–5.0)
Alkaline Phosphatase: 47 U/L (ref 38–126)
Anion gap: 6 (ref 5–15)
BUN: 19 mg/dL (ref 8–23)
CO2: 28 mmol/L (ref 22–32)
Calcium: 9.5 mg/dL (ref 8.9–10.3)
Chloride: 103 mmol/L (ref 98–111)
Creatinine, Ser: 1.16 mg/dL — ABNORMAL HIGH (ref 0.44–1.00)
GFR, Estimated: 49 mL/min — ABNORMAL LOW (ref >60)
Glucose, Bld: 125 mg/dL — ABNORMAL HIGH (ref 70–99)
Potassium: 4.7 mmol/L (ref 3.5–5.1)
Sodium: 137 mmol/L (ref 135–145)
Total Bilirubin: 0.5 mg/dL (ref 0.3–1.2)
Total Protein: 6.6 g/dL (ref 6.5–8.1)

## 2021-11-06 LAB — CBC WITH DIFFERENTIAL/PLATELET
Abs Immature Granulocytes: 0.03 10*3/uL (ref 0.00–0.07)
Basophils Absolute: 0 10*3/uL (ref 0.0–0.1)
Basophils Relative: 1 %
Eosinophils Absolute: 0.1 10*3/uL (ref 0.0–0.5)
Eosinophils Relative: 2 %
HCT: 37 % (ref 36.0–46.0)
Hemoglobin: 11.6 g/dL — ABNORMAL LOW (ref 12.0–15.0)
Immature Granulocytes: 0 %
Lymphocytes Relative: 38 %
Lymphs Abs: 2.5 10*3/uL (ref 0.7–4.0)
MCH: 29 pg (ref 26.0–34.0)
MCHC: 31.4 g/dL (ref 30.0–36.0)
MCV: 92.5 fL (ref 80.0–100.0)
Monocytes Absolute: 0.7 10*3/uL (ref 0.1–1.0)
Monocytes Relative: 11 %
Neutro Abs: 3.3 10*3/uL (ref 1.7–7.7)
Neutrophils Relative %: 48 %
Platelets: 193 10*3/uL (ref 150–400)
RBC: 4 MIL/uL (ref 3.87–5.11)
RDW: 12.9 % (ref 11.5–15.5)
WBC: 6.7 10*3/uL (ref 4.0–10.5)
nRBC: 0 % (ref 0.0–0.2)

## 2021-11-06 LAB — LIPASE, BLOOD: Lipase: 22 U/L (ref 11–51)

## 2021-11-06 MED ORDER — CEPHALEXIN 500 MG PO CAPS: 500.0000 mg | ORAL_CAPSULE | Freq: Four times a day (QID) | ORAL | 0 refills | Status: AC

## 2021-11-06 MED ORDER — IOHEXOL 300 MG/ML  SOLN
100.0000 mL | Freq: Once | INTRAMUSCULAR | Status: AC | PRN
  Administered 2021-11-06: 100 mL via INTRAVENOUS

## 2021-11-06 MED ORDER — CEPHALEXIN 500 MG PO CAPS: 500.0000 mg | ORAL_CAPSULE | Freq: Four times a day (QID) | ORAL | 0 refills | Status: DC

## 2021-11-06 NOTE — Discharge Instructions (Signed)
Please follow-up with your primary doctor on Monday as discussed.  Take the antibiotic as prescribed for your suspected skin infection and urinary tract infection.  Come back to ER if you develop worsening abdominal pain, vomiting, fever or other new concerning symptom.

## 2021-11-06 NOTE — ED Provider Notes (Signed)
[facility] EMERGENCY DEPT Provider Note   CSN: [id number] Arrival date & time: 11/06/21  0908     History Chief Complaint  Patient presents with   Abscess    Nicole Lloyd is a 75 y.o. female.  Presents to ER with concern for abdominal pain, redness around umbilicus.  Patient reports that since Thursday she has been having some intermittent abdominal discomfort, seems to come and go, currently mild, was more severe yesterday and the day before.  Pain is primarily in left lower abdomen.  She also noticed some redness around her umbilicus.  She denies fever.  No chills or nausea or vomiting.  Extensive past medical history including history of stroke, CAD, GERD, diabetes, hypertension.  Past abdominal surgical history includes cholecystectomy, ventral hernia repair, hysterectomy.  HPI     Past Medical History:  Diagnosis Date   Arthritis    Cholelithiases    Coronary artery disease    CVA (cerebral infarction)    Diabetes mellitus without complication ([place])    Gall stones    GERD (gastroesophageal reflux disease)    Hypertension    Myocardial infarction ([place])    Schizophrenia ([place])    Stroke ([place]) 06/1015   right side weakness, some speech impairment at times    Patient Active Problem List   Diagnosis Date Noted   Asymptomatic stenosis of left carotid artery 07/03/2020   Abnormal stress test 07/03/2020   Essential hypertension 04/13/2020   Hallucinations 04/13/2020   Allergic rhinitis 09/09/2019   Impaired functional mobility, balance, gait, and endurance 09/09/2019   H/O: stroke 09/09/2019   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/09/2017   Coronary artery disease of native artery of native heart with stable angina pectoris ([place]) 02/09/2017   Congestive heart failure, NYHA class III, acute on chronic, combined ([place]) 02/09/2017   OSA on CPAP 02/09/2017   Influenza 12/09/2016   Chronic cholecystitis with calculus 09/06/2016   CKD  (chronic kidney disease) stage 2, GFR 60-89 ml/min 10/03/2015   Cerebrovascular accident (CVA) due to vascular stenosis ([place]) 07/03/2015   Type 2 diabetes mellitus ([place]) 08/20/2014   Abnormal electrocardiogram 08/20/2014   Carotid bruit 08/20/2014   Chest tightness or pressure 08/20/2014   Coronary artery disease involving coronary bypass graft with angina pectoris ([place]) 08/20/2014   Dyslipidemia 08/20/2014   DOE (dyspnea on exertion) 08/20/2014    Past Surgical History:  Procedure Laterality Date   CHOLECYSTECTOMY N/A 09/06/2016   Procedure: LAPAROSCOPIC CHOLECYSTECTOMY WITH INTRAOPERATIVE CHOLANGIOGRAM;  Surgeon: Donnie Mesa, MD;  Location: [facility];  Service: General;  Laterality: N/A;   CORONARY ARTERY BYPASS GRAFT     LEFT HEART CATH AND CORS/GRAFTS ANGIOGRAPHY N/A 07/14/2020   Procedure: LEFT HEART CATH AND CORS/GRAFTS ANGIOGRAPHY;  Surgeon: Nigel Mormon, MD;  Location: [facility];  Service: Cardiovascular;  Laterality: N/A;   oopherectomy Bilateral    TOTAL ABDOMINAL HYSTERECTOMY     VENTRAL HERNIA REPAIR       OB History   No obstetric history on file.     Family History  Problem Relation Age of Onset   Arthritis Mother    CVA Mother    Depression Mother    Diabetes Mother    Hypertension Mother    Seizures Mother    Alcohol abuse Brother    Cervical cancer Daughter     Social History   Tobacco Use   Smoking status: Never   Smokeless tobacco: Never  Vaping Use   Vaping Use:  Never used  Substance Use Topics   Alcohol use: No   Drug use: No    Home Medications Prior to Admission medications   Medication Sig Start Date End Date Taking? Authorizing Provider  acetaminophen (TYLENOL) 325 MG tablet Take 2 tablets (650 mg total) by mouth every 6 (six) hours as needed. 02/26/13   Lianne Cure, DO  aspirin EC 81 MG tablet Take 1 tablet (81 mg total) by mouth daily. Swallow whole. 07/21/20   Patwardhan, Reynold Bowen, MD  calcium carbonate (TUMS - DOSED  IN MG ELEMENTAL CALCIUM) 500 MG chewable tablet Chew 1-2 tablets by mouth 2 (two) times daily as needed for indigestion or heartburn.     [provider]  cephALEXin (KEFLEX) 500 MG capsule Take 1 capsule (500 mg total) by mouth 4 (four) times daily for 7 days. 11/06/21 11/13/21  Lucrezia Starch, MD  citalopram (CELEXA) 10 MG tablet Take 10 mg by mouth daily.    [provider]  glimepiride (AMARYL) 1 MG tablet Take 1 mg by mouth daily with breakfast.    [provider]  guaifenesin (ROBITUSSIN) 100 MG/5ML syrup Take 5-10 mLs (100-200 mg total) by mouth every 4 (four) hours as needed for cough. 4/81/85   Campbell Stall P, DO  losartan-hydrochlorothiazide (HYZAAR) 100-25 MG tablet TAKE ONE TABLET BY MOUTH ONCE DAILY AS NEEDED 06/22/20   Patwardhan, Reynold Bowen, MD  metFORMIN (GLUCOPHAGE) 1000 MG tablet Take 1,000 mg by mouth at bedtime.     [provider]  metoprolol tartrate (LOPRESSOR) 25 MG tablet Take 1 tablet (25 mg total) by mouth in the morning and at bedtime. 07/21/20   Patwardhan, Reynold Bowen, MD  nirmatrelvir & ritonavir (PAXLOVID, 300/100,) 20 x 150 MG & 10 x 100MG  TBPK Take 300 mg nirmatrelvir with 100 mg of ritonavir twice daily for five days 6/31/49   Campbell Stall P, DO  nitroGLYCERIN (NITROSTAT) 0.4 MG SL tablet Place 1 tablet (0.4 mg total) under the tongue every 5 (five) minutes x 3 doses as needed for chest pain. 07/21/20 10/19/20  Patwardhan, Reynold Bowen, MD  ondansetron (ZOFRAN) 4 MG tablet Take 1 tablet (4 mg total) by mouth every 6 (six) hours. 05/22/62   Campbell Stall P, DO  rosuvastatin (CRESTOR) 40 MG tablet Take 40 mg by mouth every evening.    [provider]    Allergies    Patient has no known allergies.  Review of Systems   Review of Systems  Constitutional:  Negative for chills and fever.  HENT:  Negative for ear pain and sore throat.   Eyes:  Negative for pain and visual disturbance.  Respiratory:  Negative for cough and shortness  of breath.   Cardiovascular:  Negative for chest pain and palpitations.  Gastrointestinal:  Positive for abdominal pain. Negative for nausea and vomiting.  Genitourinary:  Negative for dysuria and hematuria.  Musculoskeletal:  Negative for arthralgias and back pain.  Skin:  Positive for rash. Negative for color change.  Neurological:  Negative for seizures and syncope.  All other systems reviewed and are negative.  Physical Exam Updated Vital Signs BP 121/62 (BP Location: Right Arm)    Pulse 61    Temp 97.6 F (36.4 C) (Oral)    Resp 19    Ht 5\' 5"  (1.651 m)    Wt 81.6 kg    SpO2 97%    BMI 29.95 kg/m   Physical Exam Vitals and nursing note reviewed.  Constitutional:  General: She is not in acute distress.    Appearance: She is well-developed.  HENT:     Head: Normocephalic and atraumatic.  Eyes:     Conjunctiva/sclera: Conjunctivae normal.  Cardiovascular:     Rate and Rhythm: Normal rate and regular rhythm.     Heart sounds: No murmur heard. Pulmonary:     Effort: Pulmonary effort is normal. No respiratory distress.     Breath sounds: Normal breath sounds.  Abdominal:     Palpations: Abdomen is soft.     Tenderness: There is no abdominal tenderness.     Comments: Tenderness to palpation noted in left lower quadrant, no rebound or guarding, there is some mild erythema around umbilicus, no active drainage noted, no underlying induration or swelling appreciated  Musculoskeletal:        General: No swelling.     Cervical back: Neck supple.  Skin:    General: Skin is warm and dry.     Capillary Refill: Capillary refill takes less than 2 seconds.  Neurological:     Mental Status: She is alert.  Psychiatric:        Mood and Affect: Mood normal.    ED Results / Procedures / Treatments   Labs (all labs ordered are listed, but only abnormal results are displayed) Labs Reviewed  CBC WITH DIFFERENTIAL/PLATELET - Abnormal; Notable for the following components:      Result  Value   Hemoglobin 11.6 (*)    All other components within normal limits  COMPREHENSIVE METABOLIC PANEL - Abnormal; Notable for the following components:   Glucose, Bld 125 (*)    Creatinine, Ser 1.16 (*)    GFR, Estimated 49 (*)    All other components within normal limits  URINALYSIS, ROUTINE W REFLEX MICROSCOPIC - Abnormal; Notable for the following components:   APPearance HAZY (*)    Leukocytes,Ua LARGE (*)    Bacteria, UA MANY (*)    All other components within normal limits  LIPASE, BLOOD    EKG None  Radiology CT ABDOMEN PELVIS W CONTRAST  Result Date: 11/06/2021 CLINICAL DATA:  Left lower quadrant pain EXAM: CT ABDOMEN AND PELVIS WITH CONTRAST TECHNIQUE: Multidetector CT imaging of the abdomen and pelvis was performed using the standard protocol following bolus administration of intravenous contrast. CONTRAST:  165mL OMNIPAQUE IOHEXOL 300 MG/ML  SOLN COMPARISON:  CT abdomen and pelvis 04/26/2016 FINDINGS: Lower chest: Mild subsegmental atelectatic changes in the lower lungs. Hepatobiliary: No focal liver abnormality is seen. Status post cholecystectomy. No biliary dilatation. Pancreas: Unremarkable. No pancreatic ductal dilatation or surrounding inflammatory changes. Spleen: Normal in size without focal abnormality. Adrenals/Urinary Tract: Adrenal glands are unremarkable. Kidneys are normal, without renal calculi, focal lesion, or hydronephrosis. Bladder is unremarkable. Stomach/Bowel: No bowel obstruction, free air or pneumatosis. No bowel wall edema or thickening identified. Mild colonic diverticulosis. Moderate retained fecal material throughout the colon. No evidence of acute appendicitis. Vascular/Lymphatic: Severe atherosclerotic disease. No bulky lymphadenopathy identified. Reproductive: Status post hysterectomy. No adnexal masses. Other: No ascites.  Small umbilical hernia containing fat. Musculoskeletal: Degenerative changes in the lumbar spine. No suspicious bony lesions  identified. IMPRESSION: 1. No acute process identified. 2. Mild colonic diverticulosis. Moderate retained fecal material throughout the colon. Electronically Signed   By: Ofilia Neas M.D.   On: 11/06/2021 11:18    Procedures Procedures   Medications Ordered in ED Medications  iohexol (OMNIPAQUE) 300 MG/ML solution 100 mL (100 mLs Intravenous Contrast Given 11/06/21 1054)    ED Course  I have reviewed the triage vital signs and the nursing notes.  Pertinent labs & imaging results that were available during my care of the patient were reviewed by me and considered in my medical decision making (see chart for details).    MDM Rules/Calculators/A&P                         75 year old lady presented to ER with concern for redness around her umbilicus.  Also endorsed abdominal pain intermittently over the past couple days and had some tenderness on exam though no rebound or guarding.  Did appear to have some mild erythema around her umbilicus but no focal induration or fluctuance was appreciated on skin exam.  Basic labs grossly stable.  UA with possible UTI.  CT scan negative for acute process.  On reassessment patient well-appearing.  Recommend course of antibiotics to cover both the possible UTI and possible mild cellulitis around her umbilicus.  Recommend recheck with primary doctor on Monday this coming week.  After the discussed management above, the patient was determined to be safe for discharge.  The patient was in agreement with this plan and all questions regarding their care were answered.  ED return precautions were discussed and the patient will return to the ED with any significant worsening of condition.     Final Clinical Impression(s) / ED Diagnoses Final diagnoses:  Cellulitis, unspecified cellulitis site  Urinary tract infection without hematuria, site unspecified    Rx / DC Orders ED Discharge Orders          Ordered    cephALEXin (KEFLEX) 500 MG capsule  4  times daily,   Status:  Discontinued        11/06/21 1308    cephALEXin (KEFLEX) 500 MG capsule  4 times daily        11/06/21 1327             Lucrezia Starch, MD 11/07/21 [phone]

## 2021-11-06 NOTE — ED Triage Notes (Signed)
Pt reports skin infection noticed in folds around umbilicus since Thursday w/pain, smell and redness.

## 2023-06-15 LAB — GLUCOSE, POCT (MANUAL RESULT ENTRY): Glucose Fasting, POC: 101 mg/dL — AB (ref 70–99)

## 2023-06-29 ENCOUNTER — Encounter: Payer: Self-pay | Admitting: *Deleted

## 2023-06-29 NOTE — Progress Notes (Unsigned)
Pt attended 06/15/23 screening event where her b/p was 144/71 and her blood sugar was 101. Event documentation noted pt's PCP as Dr. Renaye Rakers and that pt did not identify any SDOH insecurities. Chart review however indicates that pt has been receiving care in [facility], where her daughter lives and phone call to Dr. Tedra Senegal office confirms Dr. Parke Simmers is no long pt's PCP "because she moved with her daughter to [place]." Chart review indicates pt has PCP Dr. Kathalene Frames from [facility] in [facility] , with whom she has been receiving care since 08/18/22. Chart review also indicates that pt has been seen most recently at [facility] office on 04/21/23 where her b/p was 124/70 (also 03/16/23 where her b/p was 158/65, and 02/09/23 when her b/p was 128/67). Future visits are not visible in [facility]. Call made to daughter to confirm pt's healthcare access as pt has dx of cognitive impairment, confirmed by Dr. Tedra Senegal office during call. Multiple calls made to pt's cell phone per chart review and to phone number entered on event health screening form for contact, which chart review indicates belongs to pt's dtr Sonya Harriston. Multiple VM left on health screening form phone number with callback #. Letter sent to pt's last known address to try to verify f/u for her HTN and possible PCP connection through the [facility] flyer, if pt is still in [place] but not seeing Dr. Parke Simmers (per Dr. Tedra Senegal office message to this health equity team member which was documented 06/29/23.

## 2023-09-04 ENCOUNTER — Encounter: Payer: Self-pay | Admitting: *Deleted

## 2023-09-05 NOTE — Progress Notes (Signed)
Pt attended 06/15/23 screening event where her b/p was 144/71 and her blood sugar was 101. At the event, the pt screening form listed Dr. Lowella Bandy as here PCP and a phone number that also belongs to her dtr Ms S Harriston; and pt did not identify any SDOH insecurities. Chart review indicates pt has received care in [place] since 12/23 as well as eye care in [facility] 03/2023 and Dr. Tedra Senegal office confirmed by phone post event that pt had transferred her care to [place] PCP. Chart review reveals that pt has seen [facility] at least monthly from 12/23 through 03/2023. However, during initial and current 60 day post event f/u, health equity team member unable to contact pt or dtr by phone to f/u PCP access or b/p f/u. 2nd letter sent to pt's last known [place] address with [facility] flyer and [facility] info in case pt needs access to [place]/local healthcare now. Additional pt f/u to be scheduled per health equity protocol.

## 2024-01-03 ENCOUNTER — Encounter: Payer: Self-pay | Admitting: *Deleted

## 2024-01-03 NOTE — Progress Notes (Signed)
Pt attended 06/15/23 screening event where her b/p was 144/71 and her blood sugar was 101. At the event, the pt's PCP was listed as Dr. Renaye Rakers; she noted she did have insurance, did not smoke, and did not have any SDOH insecurities. Initial and 60 day event f/u revealed that pt no longer saw Dr. Parke Simmers as her PCP and pt had moved to [place]. Today, chart review indicates pt currently lives in [place] where she goes the the [facility] primary care clinic. She has had multiple visits with a resident in training at the clinic, Dr. Alcide Clever,  over the past 12 months but her official PCP assigned to her is Dr. Charlies Silvers. Pt last seen on 09/14/23 at that clinic where her b/p was 120/62. Pt's chart demographics updated. No additional health equity team support indicated at this time.

## 2024-07-28 ENCOUNTER — Other Ambulatory Visit: Payer: Self-pay

## 2024-07-28 ENCOUNTER — Emergency Department (HOSPITAL_COMMUNITY)

## 2024-07-28 ENCOUNTER — Encounter (HOSPITAL_COMMUNITY): Payer: Self-pay | Admitting: Emergency Medicine

## 2024-07-28 ENCOUNTER — Emergency Department (HOSPITAL_COMMUNITY)
Admission: EM | Admit: 2024-07-28 | Discharge: 2024-07-28 | Disposition: A | Discharge status: Home / Self Care | Attending: Emergency Medicine | Admitting: Emergency Medicine

## 2024-07-28 DIAGNOSIS — Z7984 Long term (current) use of oral hypoglycemic drugs: Secondary | ICD-10-CM | POA: Insufficient documentation

## 2024-07-28 DIAGNOSIS — M79601 Pain in right arm: Secondary | ICD-10-CM | POA: Diagnosis present

## 2024-07-28 DIAGNOSIS — I2510 Atherosclerotic heart disease of native coronary artery without angina pectoris: Secondary | ICD-10-CM | POA: Insufficient documentation

## 2024-07-28 DIAGNOSIS — H539 Unspecified visual disturbance: Secondary | ICD-10-CM | POA: Insufficient documentation

## 2024-07-28 DIAGNOSIS — Z7982 Long term (current) use of aspirin: Secondary | ICD-10-CM | POA: Insufficient documentation

## 2024-07-28 DIAGNOSIS — Z79899 Other long term (current) drug therapy: Secondary | ICD-10-CM | POA: Insufficient documentation

## 2024-07-28 DIAGNOSIS — Z8673 Personal history of transient ischemic attack (TIA), and cerebral infarction without residual deficits: Secondary | ICD-10-CM | POA: Diagnosis not present

## 2024-07-28 DIAGNOSIS — I10 Essential (primary) hypertension: Secondary | ICD-10-CM | POA: Insufficient documentation

## 2024-07-28 DIAGNOSIS — E119 Type 2 diabetes mellitus without complications: Secondary | ICD-10-CM | POA: Diagnosis not present

## 2024-07-28 DIAGNOSIS — M4802 Spinal stenosis, cervical region: Secondary | ICD-10-CM | POA: Diagnosis not present

## 2024-07-28 LAB — I-STAT CHEM 8, ED
BUN: 12 mg/dL (ref 8–23)
Calcium, Ion: 1.17 mmol/L (ref 1.15–1.40)
Chloride: 106 mmol/L (ref 98–111)
Creatinine, Ser: 1.1 mg/dL — ABNORMAL HIGH (ref 0.44–1.00)
Glucose, Bld: 119 mg/dL — ABNORMAL HIGH (ref 70–99)
HCT: 35 % — ABNORMAL LOW (ref 36.0–46.0)
Hemoglobin: 11.9 g/dL — ABNORMAL LOW (ref 12.0–15.0)
Potassium: 4.2 mmol/L (ref 3.5–5.1)
Sodium: 140 mmol/L (ref 135–145)
TCO2: 22 mmol/L (ref 22–32)

## 2024-07-28 LAB — DIFFERENTIAL
Abs Immature Granulocytes: 0.05 K/uL (ref 0.00–0.07)
Basophils Absolute: 0.1 K/uL (ref 0.0–0.1)
Basophils Relative: 1 %
Eosinophils Absolute: 0.1 K/uL (ref 0.0–0.5)
Eosinophils Relative: 1 %
Immature Granulocytes: 1 %
Lymphocytes Relative: 35 %
Lymphs Abs: 3.1 K/uL (ref 0.7–4.0)
Monocytes Absolute: 1 K/uL (ref 0.1–1.0)
Monocytes Relative: 11 %
Neutro Abs: 4.6 K/uL (ref 1.7–7.7)
Neutrophils Relative %: 51 %

## 2024-07-28 LAB — PROTIME-INR
INR: 1 (ref 0.8–1.2)
Prothrombin Time: 13.3 s (ref 11.4–15.2)

## 2024-07-28 LAB — CBC
HCT: 36.8 % (ref 36.0–46.0)
Hemoglobin: 11.5 g/dL — ABNORMAL LOW (ref 12.0–15.0)
MCH: 29.6 pg (ref 26.0–34.0)
MCHC: 31.3 g/dL (ref 30.0–36.0)
MCV: 94.6 fL (ref 80.0–100.0)
Platelets: 177 K/uL (ref 150–400)
RBC: 3.89 MIL/uL (ref 3.87–5.11)
RDW: 12.7 % (ref 11.5–15.5)
WBC: 8.8 K/uL (ref 4.0–10.5)
nRBC: 0 % (ref 0.0–0.2)

## 2024-07-28 LAB — COMPREHENSIVE METABOLIC PANEL WITH GFR
ALT: 12 U/L (ref 0–44)
AST: 20 U/L (ref 15–41)
Albumin: 3.4 g/dL — ABNORMAL LOW (ref 3.5–5.0)
Alkaline Phosphatase: 52 U/L (ref 38–126)
Anion gap: 8 (ref 5–15)
BUN: 11 mg/dL (ref 8–23)
CO2: 25 mmol/L (ref 22–32)
Calcium: 9.3 mg/dL (ref 8.9–10.3)
Chloride: 104 mmol/L (ref 98–111)
Creatinine, Ser: 1.01 mg/dL — ABNORMAL HIGH (ref 0.44–1.00)
GFR, Estimated: 57 mL/min — ABNORMAL LOW (ref >60)
Glucose, Bld: 121 mg/dL — ABNORMAL HIGH (ref 70–99)
Potassium: 4.1 mmol/L (ref 3.5–5.1)
Sodium: 137 mmol/L (ref 135–145)
Total Bilirubin: 0.8 mg/dL (ref 0.0–1.2)
Total Protein: 6.5 g/dL (ref 6.5–8.1)

## 2024-07-28 MED ORDER — DEXAMETHASONE SODIUM PHOSPHATE 10 MG/ML IJ SOLN
10.0000 mg | Freq: Once | INTRAMUSCULAR | Status: AC
  Administered 2024-07-28: 10 mg via INTRAVENOUS
  Filled 2024-07-28: qty 1

## 2024-07-28 MED ORDER — MORPHINE SULFATE (PF) 2 MG/ML IV SOLN
2.0000 mg | Freq: Once | INTRAVENOUS | Status: AC
  Administered 2024-07-28: 2 mg via INTRAVENOUS
  Filled 2024-07-28: qty 1

## 2024-07-28 MED ORDER — HYDROCODONE-ACETAMINOPHEN 5-325 MG PO TABS: 1.0000 | ORAL_TABLET | ORAL | 0 refills | Status: AC | PRN

## 2024-07-28 MED ORDER — METHYLPREDNISOLONE 4 MG PO TBPK
ORAL_TABLET | Freq: Every day | ORAL | 0 refills | Status: AC
Start: 2024-07-28 — End: ?

## 2024-07-28 MED ORDER — ONDANSETRON HCL 4 MG/2ML IJ SOLN
4.0000 mg | Freq: Once | INTRAMUSCULAR | Status: AC
  Administered 2024-07-28: 4 mg via INTRAVENOUS
  Filled 2024-07-28: qty 2

## 2024-07-28 NOTE — ED Notes (Signed)
 Phlebotomy asked to stick

## 2024-07-28 NOTE — Discharge Instructions (Addendum)
 Increase your aspirin  to 324 mg daily.

## 2024-07-28 NOTE — ED Provider Notes (Signed)
 [facility] Provider Note   CSN: [id number] Arrival date & time: 07/28/24  1027     Patient presents with: right arm pain, stuttering   Nicole Lloyd is a 78 y.o. female.   Pt is a 78 yo female with pmhx significant for HTN, CAD, DM, CVA, GERD, CAD, and schizophrenia.  Pt comes in today with several complaints.  Pt said she had loss of vision in her left eye about 2 weeks ago.  It resolved after several minutes.  Pt did not tell family or pcp.  She also has been having right arm pain radiating down right arm.  She did tell her pcp about that and said her pcp gave her a shot (unkn med) which did not help.  The pt also said her stuttering has been worse today.  Pt said the pain in her right arm is down to the bone.       Prior to Admission medications   Medication Sig Start Date End Date Taking? Authorizing Provider  HYDROcodone -acetaminophen  (NORCO/VICODIN) 5-325 MG tablet Take 1 tablet by mouth every 4 (four) hours as needed. 07/28/24  Yes Dean Clarity, MD  methylPREDNISolone  (MEDROL  DOSEPAK) 4 MG TBPK tablet Take by mouth daily. Day 1:  2 pills at breakfast, 1 pill at lunch, 1 pill after supper, 2 pills at bedtime;Day 2:  1 pill at breakfast, 1 pill at lunch, 1 pill after supper, 2 pills at bedtime;Day 3:  1 pill at breakfast, 1 pill at lunch, 1 pill after supper, 1 pill at bedtime;Day 4:  1 pill at breakfast, 1 pill at lunch, 1 pill at bedtime;Day 5:  1 pill at breakfast, 1 pill at bedtime;Day 6:  1 pill at breakfast 07/28/24  Yes Dean Clarity, MD  acetaminophen  (TYLENOL ) 325 MG tablet Take 2 tablets (650 mg total) by mouth every 6 (six) hours as needed. 07/08/21   Elnor Hila P, DO  aspirin  EC 81 MG tablet Take 1 tablet (81 mg total) by mouth daily. Swallow whole. 07/21/20   Patwardhan, Newman PARAS, MD  calcium carbonate (TUMS - DOSED IN MG ELEMENTAL CALCIUM) 500 MG chewable tablet Chew 1-2 tablets by mouth 2 (two) times daily as needed for  indigestion or heartburn.     [provider]  citalopram (CELEXA) 10 MG tablet Take 10 mg by mouth daily.    [provider]  glimepiride (AMARYL) 1 MG tablet Take 1 mg by mouth daily with breakfast.    [provider]  guaifenesin  (ROBITUSSIN) 100 MG/5ML syrup Take 5-10 mLs (100-200 mg total) by mouth every 4 (four) hours as needed for cough. 07/08/21   Elnor Hila P, DO  losartan -hydrochlorothiazide (HYZAAR) 100-25 MG tablet TAKE ONE TABLET BY MOUTH ONCE DAILY AS NEEDED 06/22/20   Patwardhan, Newman PARAS, MD  metFORMIN  (GLUCOPHAGE ) 1000 MG tablet Take 1,000 mg by mouth at bedtime.     [provider]  metoprolol  tartrate (LOPRESSOR ) 25 MG tablet Take 1 tablet (25 mg total) by mouth in the morning and at bedtime. 07/21/20   Patwardhan, Newman PARAS, MD  nirmatrelvir & ritonavir  (PAXLOVID , 300/100,) 20 x 150 MG & 10 x 100MG  TBPK Take 300 mg nirmatrelvir with 100 mg of ritonavir twice daily for five days 07/08/21   Elnor Hila P, DO  nitroGLYCERIN  (NITROSTAT ) 0.4 MG SL tablet Place 1 tablet (0.4 mg total) under the tongue every 5 (five) minutes x 3 doses as needed for chest pain. 07/21/20 10/19/20  Patwardhan,  Manish J, MD  ondansetron  (ZOFRAN ) 4 MG tablet Take 1 tablet (4 mg total) by mouth every 6 (six) hours. 07/08/21   Elnor Hila P, DO  rosuvastatin (CRESTOR) 40 MG tablet Take 40 mg by mouth every evening.    [provider]    Allergies: Patient has no known allergies.    Review of Systems  Eyes:        Vision loss 2 weeks ago  Musculoskeletal:        Right arm pain  All other systems reviewed and are negative.   Updated Vital Signs BP 127/62 (BP Location: Right Arm)   Pulse (!) 58   Temp 97.9 F (36.6 C) (Oral)   Resp 14   SpO2 98%   Physical Exam Vitals and nursing note reviewed.  Constitutional:      Appearance: Normal appearance.  HENT:     Head: Normocephalic and atraumatic.     Right Ear: External ear normal.     Left Ear: External  ear normal.     Nose: Nose normal.     Mouth/Throat:     Mouth: Mucous membranes are moist.     Pharynx: Oropharynx is clear.  Eyes:     Extraocular Movements: Extraocular movements intact.     Conjunctiva/sclera: Conjunctivae normal.     Pupils: Pupils are equal, round, and reactive to light.  Cardiovascular:     Rate and Rhythm: Normal rate and regular rhythm.     Pulses: Normal pulses.     Heart sounds: Normal heart sounds.  Pulmonary:     Effort: Pulmonary effort is normal.     Breath sounds: Normal breath sounds.  Abdominal:     General: Abdomen is flat. Bowel sounds are normal.     Palpations: Abdomen is soft.  Musculoskeletal:     Right shoulder: Decreased range of motion.     Cervical back: Normal range of motion and neck supple.     Comments: Right arm pain weakness.    Skin:    General: Skin is warm.     Capillary Refill: Capillary refill takes less than 2 seconds.  Neurological:     Mental Status: She is alert.     Comments: Pt does have stuttering speech, but I am easily able to understand her.   Psychiatric:        Mood and Affect: Mood normal.        Behavior: Behavior normal.     (all labs ordered are listed, but only abnormal results are displayed) Labs Reviewed  CBC - Abnormal; Notable for the following components:      Result Value   Hemoglobin 11.5 (*)    All other components within normal limits  COMPREHENSIVE METABOLIC PANEL WITH GFR - Abnormal; Notable for the following components:   Glucose, Bld 121 (*)    Creatinine, Ser 1.01 (*)    Albumin 3.4 (*)    GFR, Estimated 57 (*)    All other components within normal limits  I-STAT CHEM 8, ED - Abnormal; Notable for the following components:   Creatinine, Ser 1.10 (*)    Glucose, Bld 119 (*)    Hemoglobin 11.9 (*)    HCT 35.0 (*)    All other components within normal limits  PROTIME-INR  DIFFERENTIAL  URINALYSIS, ROUTINE W REFLEX MICROSCOPIC    EKG: EKG Interpretation Date/Time:  Sunday  July 28 2024 12:01:16 EDT Ventricular Rate:  55 PR Interval:  153 QRS Duration:  100 QT Interval:  439 QTC Calculation: 420 R Axis:   55  Text Interpretation: Sinus rhythm Anteroseptal infarct, age indeterminate Since last tracing rate slower Confirmed by Dean Clarity [phone]) on 07/28/2024 12:26:37 PM  Radiology: MR CERVICAL SPINE WO CONTRAST Result Date: 07/28/2024 CLINICAL DATA:  Cervical radiculopathy, no red flags. Neck and right arm pain. EXAM: MRI CERVICAL SPINE WITHOUT CONTRAST TECHNIQUE: Multiplanar, multisequence MR imaging of the cervical spine was performed. No intravenous contrast was administered. COMPARISON:  Cervical spine radiographs 06/25/2021 FINDINGS: The study is motion degraded throughout, including moderate to severe motion on axial sequences. Alignment: Mild reversal of the normal cervical lordosis. No significant listhesis. Vertebrae: No fracture, suspicious marrow lesion, or significant marrow edema. Multilevel chronic degenerative endplate changes. Cord: No cord signal abnormality is identified within limitation of motion. Posterior Fossa, vertebral arteries, paraspinal tissues: Unremarkable. Disc levels: C2-3: Mild disc bulging and uncovertebral spurring result in mild spinal stenosis without neural foraminal stenosis. C4-5: Right eccentric disc bulging, uncovertebral spurring, and mild facet arthrosis result in moderate spinal stenosis with mild cord flattening and severe right and mild left neural foraminal stenosis. C4-5: Mild disc bulging, right greater than left uncovertebral spurring, and mild facet arthrosis result in mild spinal stenosis and severe right and mild left neural foraminal stenosis. C5-6: Mild disc space narrowing. Right eccentric disc bulging and uncovertebral spurring result in mild-to-moderate spinal stenosis with mild right-sided cord flattening and severe right and mild left neural foraminal stenosis. C6-7: Disc bulging and uncovertebral spurring  result in moderate spinal stenosis with mild cord flattening and severe right and moderate left neural foraminal stenosis. C7-T1: Negative. IMPRESSION: 1. Motion degraded examination. 2. Diffuse cervical disc degeneration with moderate spinal stenosis at C4-5 and C6-7 and mild-to-moderate spinal stenosis at C5-6. 3. Severe right neural foraminal stenosis from C4-5 to C6-7. Electronically Signed   By: Dasie Hamburg M.D.   On: 07/28/2024 14:39   MR BRAIN WO CONTRAST Result Date: 07/28/2024 CLINICAL DATA:  Neuro deficit, acute, stroke suspected. Stuttering speech. EXAM: MRI HEAD WITHOUT CONTRAST TECHNIQUE: Multiplanar, multiecho pulse sequences of the brain and surrounding structures were obtained without intravenous contrast. COMPARISON:  Head MRI 11/27/2004 FINDINGS: The study is mildly motion degraded. Brain: There is no evidence of an acute infarct, intracranial hemorrhage, mass, midline shift, or extra-axial fluid collection. Patchy to confluent T2 hyperintensities in the cerebral white matter bilaterally have progressed from the remote prior MRI and are nonspecific but compatible with moderately severe chronic small vessel ischemic disease. Chronic lacunar infarcts are again seen in the basal ganglia and right thalamus. A tiny chronic right cerebellar infarct appears new. There is mild cerebral atrophy. Vascular: Major intracranial vascular flow voids are preserved. Skull and upper cervical spine: Unremarkable bone marrow signal. Sinuses/Orbits: Bilateral cataract extraction. Paranasal sinuses and mastoid air cells are clear. Other: None. IMPRESSION: 1. No acute intracranial abnormality. 2. Moderately severe chronic small vessel ischemic disease. Electronically Signed   By: Dasie Hamburg M.D.   On: 07/28/2024 14:32   DG Chest 2 View Result Date: 07/28/2024 CLINICAL DATA:  Right arm pain radiating from the neck. EXAM: CHEST - 2 VIEW COMPARISON:  July 08, 2021 FINDINGS: Multiple sternal wires and vascular clips  are seen. The heart size and mediastinal contours are within normal limits. No acute infiltrate, pleural effusion or pneumothorax is identified. Radiopaque surgical clips are seen within the right upper quadrant. The visualized skeletal structures are unremarkable. IMPRESSION: 1. Evidence of prior median sternotomy/CABG. 2. No acute cardiopulmonary disease. Electronically Signed   By:  Suzen Dials M.D.   On: 07/28/2024 13:20   DG Shoulder Right Result Date: 07/28/2024 CLINICAL DATA:  Right arm pain. EXAM: RIGHT SHOULDER - 2+ VIEW COMPARISON:  None Available. FINDINGS: There is no evidence of fracture or dislocation. Mild degenerative changes seen involving the right acromioclavicular joint. Soft tissues are unremarkable. IMPRESSION: Mild degenerative changes involving the right acromioclavicular joint. Electronically Signed   By: Suzen Dials M.D.   On: 07/28/2024 13:18     Procedures   Medications Ordered in the ED  dexamethasone  (DECADRON ) injection 10 mg (has no administration in time range)  morphine  (PF) 2 MG/ML injection 2 mg (2 mg Intravenous Given 07/28/24 1437)  ondansetron  (ZOFRAN ) injection 4 mg (4 mg Intravenous Given 07/28/24 1436)                                    Medical Decision Making Amount and/or Complexity of Data Reviewed Labs: ordered. Radiology: ordered.  Risk Prescription drug management.   This patient presents to the ED for concern of right arm pain, this involves an extensive number of treatment options, and is a complaint that carries with it a high risk of complications and morbidity.  The differential diagnosis includes cva, tia, radicular pain   Co morbidities that complicate the patient evaluation  HTN, CAD, DM, CVA, GERD, CAD, and schizophrenia   Additional history obtained:  Additional history obtained from epic chart review External records from outside source obtained and reviewed including family   Lab Tests:  I Ordered, and  personally interpreted labs.  The pertinent results include:  cbc nl other than hgb low at 11.5 (stable); cmp nl; inr nl   Imaging Studies ordered:  I ordered imaging studies including cxr, shoulder, mri brain, mri cervical spine  I independently visualized and interpreted imaging which showed  CXR: Evidence of prior median sternotomy/CABG.  2. No acute cardiopulmonary disease.  R shoulder: Mild degenerative changes involving the right acromioclavicular  joint.  MRI brain: No acute intracranial abnormality.  2. Moderately severe chronic small vessel ischemic disease.  MRI cervical spine: Motion degraded examination.  2. Diffuse cervical disc degeneration with moderate spinal stenosis  at C4-5 and C6-7 and mild-to-moderate spinal stenosis at C5-6.  3. Severe right neural foraminal stenosis from C4-5 to C6-7.   I agree with the radiologist interpretation   Cardiac Monitoring:  The patient was maintained on a cardiac monitor.  I personally viewed and interpreted the cardiac monitored which showed an underlying rhythm of: nsr   Medicines ordered and prescription drug management:  I ordered medication including morphine /zofran /decadron   for sx  Reevaluation of the patient after these medicines showed that the patient improved I have reviewed the patients home medicines and have made adjustments as needed   Test Considered:  mri   Critical Interventions:  Pain control   Problem List / ED Course:  Cervical spinal stenosis:  pt given decadron  in ED and is d/c with a medrol  dose pack.  She is to f/u with NS. Vision disturbance:  pt has not seen her eye doctor in over a year.  She is to f/u with ophthalmology.   Reevaluation:  After the interventions noted above, I reevaluated the patient and found that they have :improved   Social Determinants of Health:  Lives with daughter   Dispostion:  After consideration of the diagnostic results and the patients response  to treatment, I feel  that the patent would benefit from discharge with outpatient f/u.       Final diagnoses:  Vision disturbance  Spinal stenosis of cervical region    ED Discharge Orders          Ordered    methylPREDNISolone  (MEDROL  DOSEPAK) 4 MG TBPK tablet  Daily        07/28/24 1455    HYDROcodone -acetaminophen  (NORCO/VICODIN) 5-325 MG tablet  Every 4 hours PRN        07/28/24 1455               Niklas Chretien, MD 07/28/24 1519

## 2024-07-28 NOTE — ED Triage Notes (Signed)
 Pt complaining of right arm pain radiating from neck since Wednesday.  Pt also has stuttering speech. Has had it since stroke 2016 but has been worse than normal today since around 10:30. Hx of DM.
